# Patient Record
Sex: Female | Born: 1935 | Race: White | Hispanic: No | Marital: Married | Smoking: Never smoker
Health system: Southern US, Community
[De-identification: ages and names within clinical notes are randomized; demographics above are authoritative.]

## PROBLEM LIST (undated history)

## (undated) DIAGNOSIS — F0280 Dementia in other diseases classified elsewhere without behavioral disturbance: Secondary | ICD-10-CM

## (undated) DIAGNOSIS — G309 Alzheimer's disease, unspecified: Secondary | ICD-10-CM

---

## 1998-04-20 ENCOUNTER — Other Ambulatory Visit: Admission: RE | Admit: 1998-04-20 | Discharge: 1998-04-20 | Payer: Self-pay | Admitting: Obstetrics and Gynecology

## 2002-01-10 ENCOUNTER — Emergency Department (HOSPITAL_COMMUNITY): Admission: EM | Admit: 2002-01-10 | Discharge: 2002-01-10 | Payer: Self-pay | Admitting: Emergency Medicine

## 2005-02-24 ENCOUNTER — Emergency Department (HOSPITAL_COMMUNITY): Admission: EM | Admit: 2005-02-24 | Discharge: 2005-02-24 | Payer: Self-pay | Admitting: Family Medicine

## 2006-12-18 ENCOUNTER — Ambulatory Visit: Payer: Self-pay | Admitting: Internal Medicine

## 2006-12-25 ENCOUNTER — Other Ambulatory Visit: Admission: RE | Admit: 2006-12-25 | Discharge: 2006-12-25 | Payer: Self-pay | Admitting: Internal Medicine

## 2006-12-25 ENCOUNTER — Encounter: Payer: Self-pay | Admitting: Internal Medicine

## 2006-12-25 ENCOUNTER — Ambulatory Visit: Payer: Self-pay | Admitting: Internal Medicine

## 2006-12-25 DIAGNOSIS — M79609 Pain in unspecified limb: Secondary | ICD-10-CM

## 2006-12-25 LAB — CONVERTED CEMR LAB
Basophils Absolute: 0 10*3/uL (ref 0.0–0.1)
Basophils Relative: 0.6 % (ref 0.0–1.0)
Cholesterol: 256 mg/dL (ref 0–200)
Direct LDL: 169.9 mg/dL
Eosinophils Relative: 1.3 % (ref 0.0–5.0)
GFR calc non Af Amer: 74 mL/min
Glucose, Bld: 90 mg/dL (ref 70–99)
HCT: 37.2 % (ref 36.0–46.0)
Hemoglobin: 13 g/dL (ref 12.0–15.0)
Lymphocytes Relative: 27.7 % (ref 12.0–46.0)
MCHC: 35 g/dL (ref 30.0–36.0)
RBC: 4 M/uL (ref 3.87–5.11)
RDW: 12 % (ref 11.5–14.6)
Sodium: 142 meq/L (ref 135–145)
Total CHOL/HDL Ratio: 3.6
Triglycerides: 78 mg/dL (ref 0–149)
VLDL: 16 mg/dL (ref 0–40)

## 2007-01-03 ENCOUNTER — Encounter: Admission: RE | Admit: 2007-01-03 | Discharge: 2007-01-03 | Payer: Self-pay | Admitting: Internal Medicine

## 2007-01-11 ENCOUNTER — Encounter: Admission: RE | Admit: 2007-01-11 | Discharge: 2007-01-11 | Payer: Self-pay | Admitting: Internal Medicine

## 2010-10-16 ENCOUNTER — Encounter: Payer: Self-pay | Admitting: Internal Medicine

## 2011-02-27 ENCOUNTER — Inpatient Hospital Stay (INDEPENDENT_AMBULATORY_CARE_PROVIDER_SITE_OTHER)
Admission: RE | Admit: 2011-02-27 | Discharge: 2011-02-27 | Disposition: A | Discharge status: Home / Self Care | Payer: Medicare HMO | Source: Ambulatory Visit | Attending: Emergency Medicine | Admitting: Emergency Medicine

## 2011-02-27 DIAGNOSIS — S81809A Unspecified open wound, unspecified lower leg, initial encounter: Secondary | ICD-10-CM

## 2011-02-27 DIAGNOSIS — S8010XA Contusion of unspecified lower leg, initial encounter: Secondary | ICD-10-CM

## 2011-06-05 ENCOUNTER — Other Ambulatory Visit: Payer: Self-pay | Admitting: Dermatology

## 2011-10-09 ENCOUNTER — Other Ambulatory Visit: Payer: Self-pay | Admitting: Dermatology

## 2012-10-15 ENCOUNTER — Ambulatory Visit
Admission: RE | Admit: 2012-10-15 | Discharge: 2012-10-15 | Disposition: A | Discharge status: Home / Self Care | Payer: Medicare HMO | Source: Ambulatory Visit | Attending: Geriatric Medicine | Admitting: Geriatric Medicine

## 2012-10-15 ENCOUNTER — Other Ambulatory Visit: Payer: Self-pay | Admitting: Geriatric Medicine

## 2012-10-15 DIAGNOSIS — R269 Unspecified abnormalities of gait and mobility: Secondary | ICD-10-CM

## 2013-11-20 ENCOUNTER — Ambulatory Visit (HOSPITAL_COMMUNITY)
Admission: RE | Admit: 2013-11-20 | Discharge: 2013-11-20 | Disposition: A | Discharge status: Home / Self Care | Payer: Medicare HMO | Source: Ambulatory Visit | Attending: Geriatric Medicine | Admitting: Geriatric Medicine

## 2013-11-20 ENCOUNTER — Other Ambulatory Visit (HOSPITAL_COMMUNITY): Payer: Self-pay | Admitting: Unknown Physician Specialty

## 2013-11-20 ENCOUNTER — Other Ambulatory Visit: Payer: Self-pay | Admitting: Geriatric Medicine

## 2013-11-20 ENCOUNTER — Other Ambulatory Visit (HOSPITAL_COMMUNITY): Payer: Self-pay | Admitting: Geriatric Medicine

## 2013-11-20 DIAGNOSIS — I82403 Acute embolism and thrombosis of unspecified deep veins of lower extremity, bilateral: Secondary | ICD-10-CM

## 2013-11-20 DIAGNOSIS — R609 Edema, unspecified: Secondary | ICD-10-CM | POA: Insufficient documentation

## 2013-11-20 DIAGNOSIS — M79606 Pain in leg, unspecified: Secondary | ICD-10-CM

## 2013-11-20 DIAGNOSIS — R7989 Other specified abnormal findings of blood chemistry: Secondary | ICD-10-CM

## 2013-11-20 DIAGNOSIS — M79609 Pain in unspecified limb: Secondary | ICD-10-CM | POA: Insufficient documentation

## 2013-11-20 DIAGNOSIS — M7989 Other specified soft tissue disorders: Secondary | ICD-10-CM

## 2013-11-20 DIAGNOSIS — I82409 Acute embolism and thrombosis of unspecified deep veins of unspecified lower extremity: Secondary | ICD-10-CM

## 2013-11-20 NOTE — Progress Notes (Signed)
VASCULAR LAB PRELIMINARY  PRELIMINARY  PRELIMINARY  PRELIMINARY  Bilateral lower extremity venous duplex completed.    Preliminary report:  Bilateral:  No evidence of DVT, superficial thrombosis, or Baker's Cyst.   Christa Fasig, RVS 11/20/2013, 12:47 PM

## 2014-02-21 ENCOUNTER — Encounter (HOSPITAL_COMMUNITY): Payer: Self-pay | Admitting: Emergency Medicine

## 2014-02-21 ENCOUNTER — Emergency Department (HOSPITAL_COMMUNITY)
Admission: EM | Admit: 2014-02-21 | Discharge: 2014-02-22 | Disposition: A | Discharge status: Home / Self Care | Payer: Medicare HMO | Attending: Emergency Medicine | Admitting: Emergency Medicine

## 2014-02-21 ENCOUNTER — Emergency Department (HOSPITAL_COMMUNITY): Payer: Medicare HMO

## 2014-02-21 DIAGNOSIS — F0280 Dementia in other diseases classified elsewhere without behavioral disturbance: Secondary | ICD-10-CM | POA: Insufficient documentation

## 2014-02-21 DIAGNOSIS — R109 Unspecified abdominal pain: Secondary | ICD-10-CM

## 2014-02-21 DIAGNOSIS — R1013 Epigastric pain: Secondary | ICD-10-CM | POA: Insufficient documentation

## 2014-02-21 DIAGNOSIS — K5900 Constipation, unspecified: Secondary | ICD-10-CM | POA: Insufficient documentation

## 2014-02-21 DIAGNOSIS — R1012 Left upper quadrant pain: Secondary | ICD-10-CM | POA: Insufficient documentation

## 2014-02-21 DIAGNOSIS — G309 Alzheimer's disease, unspecified: Secondary | ICD-10-CM | POA: Insufficient documentation

## 2014-02-21 HISTORY — DX: Dementia in other diseases classified elsewhere, unspecified severity, without behavioral disturbance, psychotic disturbance, mood disturbance, and anxiety: F02.80

## 2014-02-21 HISTORY — DX: Alzheimer's disease, unspecified: G30.9

## 2014-02-21 LAB — COMPREHENSIVE METABOLIC PANEL
ALBUMIN: 3.5 g/dL (ref 3.5–5.2)
ALK PHOS: 56 U/L (ref 39–117)
ALT: 29 U/L (ref 0–35)
AST: 31 U/L (ref 0–37)
BUN: 24 mg/dL — AB (ref 6–23)
CHLORIDE: 102 meq/L (ref 96–112)
CO2: 25 mEq/L (ref 19–32)
Calcium: 9.2 mg/dL (ref 8.4–10.5)
Creatinine, Ser: 1.01 mg/dL (ref 0.50–1.10)
GFR calc Af Amer: 58 mL/min — ABNORMAL LOW (ref >90)
GFR, EST NON AFRICAN AMERICAN: 50 mL/min — AB (ref >90)
Glucose, Bld: 101 mg/dL — ABNORMAL HIGH (ref 70–99)
POTASSIUM: 4.2 meq/L (ref 3.7–5.3)
Sodium: 138 mEq/L (ref 137–147)
Total Protein: 6.9 g/dL (ref 6.0–8.3)

## 2014-02-21 LAB — URINALYSIS, ROUTINE W REFLEX MICROSCOPIC
BILIRUBIN URINE: NEGATIVE
GLUCOSE, UA: NEGATIVE mg/dL
HGB URINE DIPSTICK: NEGATIVE
Ketones, ur: NEGATIVE mg/dL
LEUKOCYTES UA: NEGATIVE
Nitrite: NEGATIVE
PH: 5.5 (ref 5.0–8.0)
Protein, ur: NEGATIVE mg/dL
Specific Gravity, Urine: 1.015 (ref 1.005–1.030)
Urobilinogen, UA: 0.2 mg/dL (ref 0.0–1.0)

## 2014-02-21 LAB — CBC WITH DIFFERENTIAL/PLATELET
BASOS ABS: 0 10*3/uL (ref 0.0–0.1)
Basophils Relative: 0 % (ref 0–1)
Eosinophils Absolute: 0.2 10*3/uL (ref 0.0–0.7)
Eosinophils Relative: 2 % (ref 0–5)
HEMATOCRIT: 34.4 % — AB (ref 36.0–46.0)
HEMOGLOBIN: 11.6 g/dL — AB (ref 12.0–15.0)
Lymphocytes Relative: 19 % (ref 12–46)
Lymphs Abs: 1.7 10*3/uL (ref 0.7–4.0)
MCH: 31.8 pg (ref 26.0–34.0)
MCHC: 33.7 g/dL (ref 30.0–36.0)
MCV: 94.2 fL (ref 78.0–100.0)
MONOS PCT: 12 % (ref 3–12)
Monocytes Absolute: 1.1 10*3/uL — ABNORMAL HIGH (ref 0.1–1.0)
NEUTROS ABS: 6.2 10*3/uL (ref 1.7–7.7)
NEUTROS PCT: 67 % (ref 43–77)
Platelets: 291 10*3/uL (ref 150–400)
RBC: 3.65 MIL/uL — ABNORMAL LOW (ref 3.87–5.11)
RDW: 12.8 % (ref 11.5–15.5)
WBC: 9.3 10*3/uL (ref 4.0–10.5)

## 2014-02-21 LAB — LIPASE, BLOOD: Lipase: 53 U/L (ref 11–59)

## 2014-02-21 MED ORDER — SODIUM CHLORIDE 0.9 % IV BOLUS (SEPSIS)
500.0000 mL | Freq: Once | INTRAVENOUS | Status: AC
  Administered 2014-02-22: 500 mL via INTRAVENOUS

## 2014-02-21 MED ORDER — ONDANSETRON HCL 4 MG/2ML IJ SOLN
4.0000 mg | Freq: Once | INTRAMUSCULAR | Status: AC
  Administered 2014-02-22: 4 mg via INTRAVENOUS
  Filled 2014-02-21: qty 2

## 2014-02-21 MED ORDER — FENTANYL CITRATE 0.05 MG/ML IJ SOLN
12.5000 ug | Freq: Once | INTRAMUSCULAR | Status: AC
  Administered 2014-02-22: 12.5 ug via INTRAVENOUS
  Filled 2014-02-21: qty 2

## 2014-02-21 NOTE — ED Notes (Signed)
Pt presents with Left side abd pain and bloating starting today after eating dinner. Pt reports increase pain to LUQ region. Pt denies nausea, vomiting, or diarrhea. Pt reports a normal BM yesterday. Pt took 2 Senakots with no relief. Pt denies passing gas or belching since dinner. Pt has a hx of Alzheimer.

## 2014-02-21 NOTE — ED Provider Notes (Signed)
CSN: [id number]     Arrival date & time 02/21/14  2053 History   First MD Initiated Contact with Patient 02/21/14 2238     Chief Complaint  Patient presents with  . Abdominal Pain     (Consider location/radiation/quality/duration/timing/severity/associated sxs/prior Treatment) HPI  Patient with hx advanced alzheimer disease p/w LUQ abdominal pain and bloating that began after dinner tonight.  Family had bbq chicken, red beans and rice, fresh greens.  Pt complained of pain and grabbed her LUQ and daughter felt patient's abdomen and thought it felt bloated.  Daughter gave patient two senna tabs.  Pt reports abdominal pain.  Denies CP, SOB.  Daughter reports patient has not been sick recently.  Level V caveat for dementia.    Past Medical History  Diagnosis Date  . Alzheimer disease    History reviewed. No pertinent past surgical history. History reviewed. No pertinent family history. History  Substance Use Topics  . Smoking status: Never Smoker   . Smokeless tobacco: Not on file  . Alcohol Use: No   OB History   Grav Para Term Preterm Abortions TAB SAB Ect Mult Living                 Review of Systems  Unable to perform ROS: Dementia      Allergies  Review of patient's allergies indicates no known allergies.  Home Medications   Prior to Admission medications   Medication Sig Start Date End Date Taking? Authorizing Provider  senna (SENOKOT) 8.6 MG tablet Take 2 tablets by mouth daily as needed for constipation.   Yes Historical Provider, MD   BP 120/49  Pulse 78  Temp(Src) 98 F (36.7 C) (Oral)  Resp 16  SpO2 98% Physical Exam  Nursing note and vitals reviewed. Constitutional: She appears well-developed and well-nourished. No distress.  HENT:  Head: Normocephalic and atraumatic.  Neck: Neck supple.  Cardiovascular: Normal rate and regular rhythm.   Pulmonary/Chest: Effort normal and breath sounds normal. No respiratory distress. She has no wheezes. She has  no rales.  Abdominal: Soft. She exhibits no distension. There is tenderness in the epigastric area and left upper quadrant. There is no rebound and no guarding.  Neurological: She is alert.  Skin: She is not diaphoretic.    ED Course  Procedures (including critical care time) Labs Review Labs Reviewed  CBC WITH DIFFERENTIAL - Abnormal; Notable for the following:    RBC 3.65 (*)    Hemoglobin 11.6 (*)    HCT 34.4 (*)    Monocytes Absolute 1.1 (*)    All other components within normal limits  COMPREHENSIVE METABOLIC PANEL - Abnormal; Notable for the following:    Glucose, Bld 101 (*)    BUN 24 (*)    Total Bilirubin <0.2 (*)    GFR calc non Af Amer 50 (*)    GFR calc Af Amer 58 (*)    All other components within normal limits  LIPASE, BLOOD  URINALYSIS, ROUTINE W REFLEX MICROSCOPIC    Imaging Review Dg Abd Acute W/chest  02/22/2014   CLINICAL DATA:  80 year old female with abdominal pain, left upper quadrant pain. Initial encounter.  EXAM: ACUTE ABDOMEN SERIES (ABDOMEN 2 VIEW & CHEST 1 VIEW)  COMPARISON:  None.  FINDINGS: Portable AP semi upright view of the chest at 0015 hrs. Lung volumes within normal limits. Normal cardiac size and mediastinal contours. Visualized tracheal air column is within normal limits. No pneumothorax or pneumoperitoneum. No confluent pulmonary opacity.  Supine and  left-side-down lateral decubitus views of the abdomen. No pneumoperitoneum. Non obstructed bowel gas pattern. Retained stool in the colon. Osteopenia. Scoliosis and degenerative changes in the lumbar spine. Advanced joint space loss and subchondral sclerosis at the right hip. No acute osseous abnormality identified.  IMPRESSION: 1.  Non obstructed bowel gas pattern, no free air. 2.  No acute cardiopulmonary abnormality. 3. Degenerative osseous changes including severe right hip osteoarthritis.   Electronically Signed   By: Augusto Gamble M.D.   On: 02/22/2014 00:20     EKG Interpretation None       12:48 AM Discussed pt with Dr Ranae Palms.  Will order CT abd/pelvis.  Dr Ranae Palms to assume care of patient at change of shift.   Reexamination of abdomen is unchanged.    MDM   Final diagnoses:  Abdominal pain    Pt with dementia p/w LUQ abdominal pain and bloating that began after dinner tonight.  Brought in by daughter.  Labs, UA unremarkable, xray negative.  CT abd/pelvis pending at change of shift.  Dr Ranae Palms to assume care.     [place], PA-C 02/22/14 [id number]

## 2014-02-22 ENCOUNTER — Emergency Department (HOSPITAL_COMMUNITY): Payer: Medicare HMO

## 2014-02-22 MED ORDER — IOHEXOL 300 MG/ML  SOLN
100.0000 mL | Freq: Once | INTRAMUSCULAR | Status: AC | PRN
  Administered 2014-02-22: 100 mL via INTRAVENOUS

## 2014-02-22 MED ORDER — POLYETHYLENE GLYCOL 3350 17 G PO PACK: 17.0000 g | PACK | Freq: Every day | ORAL | Status: AC

## 2014-02-22 NOTE — ED Provider Notes (Signed)
Medical screening examination/treatment/procedure(s) were performed by non-physician practitioner and as supervising physician I was immediately available for consultation/collaboration.   EKG Interpretation None        Malekai Markwood, MD 02/22/14 0744 

## 2014-02-22 NOTE — Discharge Instructions (Signed)
Abdominal Pain, Adult °Many things can cause abdominal pain. Usually, abdominal pain is not caused by a disease and will improve without treatment. It can often be observed and treated at home. Your health care provider will do a physical exam and possibly order blood tests and X-rays to help determine the seriousness of your pain. However, in many cases, more time must pass before a clear cause of the pain can be found. Before that point, your health care provider may not know if you need more testing or further treatment. °HOME CARE INSTRUCTIONS  °Monitor your abdominal pain for any changes. The following actions may help to alleviate any discomfort you are experiencing: °· Only take over-the-counter or prescription medicines as directed by your health care provider. °· Do not take laxatives unless directed to do so by your health care provider. °· Try a clear liquid diet (broth, tea, or water) as directed by your health care provider. Slowly move to a bland diet as tolerated. °SEEK MEDICAL CARE IF: °· You have unexplained abdominal pain. °· You have abdominal pain associated with nausea or diarrhea. °· You have pain when you urinate or have a bowel movement. °· You experience abdominal pain that wakes you in the night. °· You have abdominal pain that is worsened or improved by eating food. °· You have abdominal pain that is worsened with eating fatty foods. °SEEK IMMEDIATE MEDICAL CARE IF:  °· Your pain does not go away within 2 hours. °· You have a fever. °· You keep throwing up (vomiting). °· Your pain is felt only in portions of the abdomen, such as the right side or the left lower portion of the abdomen. °· You pass bloody or black tarry stools. °MAKE SURE YOU: °· Understand these instructions.   °· Will watch your condition.   °· Will get help right away if you are not doing well or get worse.   °Document Released: 06/21/2005 Document Revised: 07/02/2013 Document Reviewed: 05/21/2013 °ExitCare® Patient  Information ©2014 [employer]. °Constipation, Adult °Constipation is when a person has fewer than 3 bowel movements a week; has difficulty having a bowel movement; or has stools that are dry, hard, or larger than normal. As people grow older, constipation is more common. If you try to fix constipation with medicines that make you have a bowel movement (laxatives), the problem may get worse. Long-term laxative use may cause the muscles of the colon to become weak. A low-fiber diet, not taking in enough fluids, and taking certain medicines may make constipation worse. °CAUSES  °· Certain medicines, such as antidepressants, pain medicine, iron supplements, antacids, and water pills.   °· Certain diseases, such as diabetes, irritable bowel syndrome (IBS), thyroid disease, or depression.   °· Not drinking enough water.   °· Not eating enough fiber-rich foods.   °· Stress or travel. °· Lack of physical activity or exercise. °· Not going to the restroom when there is the urge to have a bowel movement. °· Ignoring the urge to have a bowel movement. °· Using laxatives too much. °SYMPTOMS  °· Having fewer than 3 bowel movements a week.   °· Straining to have a bowel movement.   °· Having hard, dry, or larger than normal stools.   °· Feeling full or bloated.   °· Pain in the lower abdomen. °· Not feeling relief after having a bowel movement. °DIAGNOSIS  °Your caregiver will take a medical history and perform a physical exam. Further testing may be done for severe constipation. Some tests may include:  °· A barium enema   X-ray to examine your rectum, colon, and sometimes, your small intestine. °· A sigmoidoscopy to examine your lower colon. °· A colonoscopy to examine your entire colon. °TREATMENT  °Treatment will depend on the severity of your constipation and what is causing it. Some dietary treatments include drinking more fluids and eating more fiber-rich foods. Lifestyle treatments may include regular exercise. If these  diet and lifestyle recommendations do not help, your caregiver may recommend taking over-the-counter laxative medicines to help you have bowel movements. Prescription medicines may be prescribed if over-the-counter medicines do not work.  °HOME CARE INSTRUCTIONS  °· Increase dietary fiber in your diet, such as fruits, vegetables, whole grains, and beans. Limit high-fat and processed sugars in your diet, such as French fries, hamburgers, cookies, candies, and soda.   °· A fiber supplement may be added to your diet if you cannot get enough fiber from foods.   °· Drink enough fluids to keep your urine clear or pale yellow.   °· Exercise regularly or as directed by your caregiver.   °· Go to the restroom when you have the urge to go. Do not hold it. °· Only take medicines as directed by your caregiver. Do not take other medicines for constipation without talking to your caregiver first. °SEEK IMMEDIATE MEDICAL CARE IF:  °· You have bright red blood in your stool.   °· Your constipation lasts for more than 4 days or gets worse.   °· You have abdominal or rectal pain.   °· You have thin, pencil-like stools. °· You have unexplained weight loss. °MAKE SURE YOU:  °· Understand these instructions. °· Will watch your condition. °· Will get help right away if you are not doing well or get worse. °Document Released: 06/09/2004 Document Revised: 12/04/2011 Document Reviewed: 06/23/2013 °ExitCare® Patient Information ©2014 [employer]. ° °

## 2014-03-18 ENCOUNTER — Ambulatory Visit: Payer: Self-pay | Admitting: Podiatry

## 2014-03-30 ENCOUNTER — Ambulatory Visit: Payer: Self-pay | Admitting: Podiatry

## 2014-04-20 ENCOUNTER — Encounter: Payer: Self-pay | Admitting: Podiatry

## 2014-04-20 ENCOUNTER — Ambulatory Visit (INDEPENDENT_AMBULATORY_CARE_PROVIDER_SITE_OTHER): Payer: Commercial Managed Care - HMO | Admitting: Podiatry

## 2014-04-20 VITALS — BP 128/66 | HR 68 | Resp 16

## 2014-04-20 DIAGNOSIS — M79609 Pain in unspecified limb: Secondary | ICD-10-CM

## 2014-04-20 DIAGNOSIS — M79673 Pain in unspecified foot: Secondary | ICD-10-CM

## 2014-04-20 DIAGNOSIS — M2010 Hallux valgus (acquired), unspecified foot: Secondary | ICD-10-CM

## 2014-04-20 DIAGNOSIS — M2040 Other hammer toe(s) (acquired), unspecified foot: Secondary | ICD-10-CM

## 2014-04-20 DIAGNOSIS — B351 Tinea unguium: Secondary | ICD-10-CM

## 2014-04-20 NOTE — Progress Notes (Signed)
Subjective:     Patient ID: Yolanda Ray, female   DOB: 1927/12/13, 80 y.o.   MRN: [id number]  Toe Pain    patient presents with forefoot instability of both feet with thick nail disease that are painful for her to cut and impossible for her caregiver to cut. She tries to wear wider shoes but her feet do hurt   Review of Systems  All other systems reviewed and are negative.      Objective:   Physical Exam  Nursing note and vitals reviewed. Cardiovascular: Intact distal pulses.   Musculoskeletal: Normal range of motion.  Skin: Skin is warm and dry.   neurovascular status intact with muscle strength adequate and range of motion of the subtalar and midtarsal joint moderately diminished. Patient is found to have significant for foot structural malalignment of the feet was structural bunion deformity hammertoe deformity and severe nail disease with brittle yellow painful nailbeds 1-5 both feet     Assessment:     Structural abnormality the feet was structural nail disease and pain 1-5 of both feet    Plan:     H&P reviewed and debrided nailbeds 1-5 both feet and instructed on wider-type shoes soaks and regular nail care. Will be seen back for is to recheck 3 months earlier if any issues should occur

## 2014-04-20 NOTE — Progress Notes (Signed)
   Subjective:    Patient ID: Yolanda MandesHarriette A Winther, female    DOB: 04-21-1928, [age] y.o.   MRN: [id number]  HPI Comments: "The 4th toe hurts"  Patient c/o tenderness 4th toe left for few months. The toe is most sensitive lateral border of nail. The toe is turned and she is walking on it. The other nails are long and thick and she would like those trimmed as well.   Toe Pain       Review of Systems  All other systems reviewed and are negative.      Objective:   Physical Exam        Assessment & Plan:

## 2014-07-27 ENCOUNTER — Ambulatory Visit (INDEPENDENT_AMBULATORY_CARE_PROVIDER_SITE_OTHER): Payer: Commercial Managed Care - HMO | Admitting: Podiatry

## 2014-07-27 DIAGNOSIS — B351 Tinea unguium: Secondary | ICD-10-CM

## 2014-07-27 DIAGNOSIS — M79673 Pain in unspecified foot: Secondary | ICD-10-CM

## 2014-07-28 NOTE — Progress Notes (Signed)
Subjective:     Patient ID: Yolanda Ray, female   DOB: May 09, 1928, 80 y.o.   MRN: [id number]  HPIpatient presents with thick yellow nailbeds 1-5 both feet that are painful and she cannot cut   Review of Systems     Objective:   Physical Exam Neurovascular status intact with thick yellow brittle nailbeds 1-5 both feet that are painful    Assessment:     Mycotic nail infection with pain 1-5 both feet    Plan:     Debris painful nailbeds 1-5 both feet with no iatrogenic bleeding noted

## 2014-08-18 ENCOUNTER — Encounter (HOSPITAL_COMMUNITY): Payer: Self-pay | Admitting: Emergency Medicine

## 2014-08-18 ENCOUNTER — Emergency Department (HOSPITAL_COMMUNITY)
Admission: EM | Admit: 2014-08-18 | Discharge: 2014-08-18 | Disposition: A | Discharge status: Home / Self Care | Payer: Medicare HMO | Attending: Emergency Medicine | Admitting: Emergency Medicine

## 2014-08-18 DIAGNOSIS — S63613A Unspecified sprain of left middle finger, initial encounter: Secondary | ICD-10-CM | POA: Diagnosis not present

## 2014-08-18 DIAGNOSIS — G309 Alzheimer's disease, unspecified: Secondary | ICD-10-CM | POA: Insufficient documentation

## 2014-08-18 DIAGNOSIS — S60413A Abrasion of left middle finger, initial encounter: Secondary | ICD-10-CM | POA: Insufficient documentation

## 2014-08-18 DIAGNOSIS — Y9301 Activity, walking, marching and hiking: Secondary | ICD-10-CM | POA: Diagnosis not present

## 2014-08-18 DIAGNOSIS — Y9289 Other specified places as the place of occurrence of the external cause: Secondary | ICD-10-CM | POA: Insufficient documentation

## 2014-08-18 DIAGNOSIS — Z79899 Other long term (current) drug therapy: Secondary | ICD-10-CM | POA: Insufficient documentation

## 2014-08-18 DIAGNOSIS — W19XXXA Unspecified fall, initial encounter: Secondary | ICD-10-CM

## 2014-08-18 DIAGNOSIS — W1839XA Other fall on same level, initial encounter: Secondary | ICD-10-CM | POA: Insufficient documentation

## 2014-08-18 DIAGNOSIS — S6992XA Unspecified injury of left wrist, hand and finger(s), initial encounter: Secondary | ICD-10-CM | POA: Diagnosis present

## 2014-08-18 DIAGNOSIS — Y998 Other external cause status: Secondary | ICD-10-CM | POA: Insufficient documentation

## 2014-08-18 DIAGNOSIS — R4182 Altered mental status, unspecified: Secondary | ICD-10-CM | POA: Insufficient documentation

## 2014-08-18 DIAGNOSIS — S63619A Unspecified sprain of unspecified finger, initial encounter: Secondary | ICD-10-CM

## 2014-08-18 DIAGNOSIS — S60419A Abrasion of unspecified finger, initial encounter: Secondary | ICD-10-CM

## 2014-08-18 NOTE — Discharge Instructions (Signed)
°  Jammed Finger A jammed finger is a term used to describe a variety of injuries. The injuries usually involve the joint in the middle of the finger (not the joint near the tip of the finger, and not the joint close to the hand). Usually, a jammed finger involves injured tendons or ligaments (sprain). CAUSES  "Jamming" a finger usually refers to "stubbing" the finger on an object, such as a ball during an athletic activity. Usually, the joint is extended at the time of injury, and the blow forces the joint further into extension than it normally goes. SYMPTOMS   Pain.  Swelling.  Discoloration and bruising around the joint.  Difficulty bending, straightening, and using the finger normally. DIAGNOSIS  An X-ray may be done to make sure there is no broken bone (fracture). TREATMENT   Put ice on the injured area.  Put ice in a plastic bag.  Place a towel between your skin and the bag.  Leave the ice on for 15-20 minutes at a time, 03-04 times a day.  Raise (elevate) the affected finger above the level of your heart to decrease swelling.  Take medicine as directed by your caregiver. Depending on the type of injury, your caregiver may also recommend that you:  "Buddy tape" the injured finger to the finger or fingers beside it.  Wear a protective splint.  Do strengthening exercises after the finger has begun to heal.  Do physical therapy to regain strength and mobility in the finger.  Follow up with a hand specialist. HOME CARE INSTRUCTIONS  Avoid activities that may injure the finger again until it is totally healed. SEEK IMMEDIATE MEDICAL CARE IF:   You develop pain that is more severe.  You develop increased swelling.  There is an obvious deformity in the joint.  You have severe bruising.  You have red or blue discoloration.  You or your child has an oral temperature above 102 F (38.9 C), not controlled by medicine.  You have an abnormally cold finger.  Feeling  in your finger is absent or decreasing. MAKE SURE YOU:   Understand these instructions.  Will watch your condition.  Will get help right away if you are not doing well or get worse. Document Released: 03/01/2010 Document Revised: 12/04/2011 Document Reviewed: 03/01/2010 [facility] Patient Information 2015 [place], [place]. This information is not intended to replace advice given to you by your health care provider. Make sure you discuss any questions you have with your health care provider.

## 2014-08-18 NOTE — ED Provider Notes (Signed)
CSN: [id number]     Arrival date & time 08/18/14  1128 History   First MD Initiated Contact with Patient 08/18/14 1137     Chief Complaint  Patient presents with  . Fall  . Altered Mental Status     (Consider location/radiation/quality/duration/timing/severity/associated sxs/prior Treatment) Patient is a 80 y.o. female presenting with fall and altered mental status. The history is provided by the patient and the EMS personnel.  Fall  Altered Mental Status  The patient was seen, by a bystander, to fall, while walking on [address]  Decreased responsiveness, so an ambulance was summoned.  They checked her CBG, and it was normal.  They transferred her here for evaluation.  Level V caveat- dementia  Past Medical History  Diagnosis Date  . Alzheimer disease    History reviewed. No pertinent past surgical history. History reviewed. No pertinent family history. History  Substance Use Topics  . Smoking status: Never Smoker   . Smokeless tobacco: Not on file  . Alcohol Use: No   OB History    No data available     Review of Systems  All other systems reviewed and are negative.     Allergies  Review of patient's allergies indicates no known allergies.  Home Medications   Prior to Admission medications   Medication Sig Start Date End Date Taking? Authorizing Provider  polyethylene glycol (MIRALAX / GLYCOLAX) packet Take 17 g by mouth daily. Patient taking differently: Take 17 g by mouth daily as needed (constipation).  02/22/14   Loren Raceravid Yelverton, MD  senna (SENOKOT) 8.6 MG tablet Take 2 tablets by mouth daily as needed for constipation.    Historical Provider, MD   BP 124/53 mmHg  Pulse 75  Resp 16  SpO2 98% Physical Exam  Constitutional: She appears well-developed.  Elderly, frail  HENT:  Head: Normocephalic and atraumatic.  Right Ear: External ear normal.  Left Ear: External ear normal.  Eyes: Conjunctivae and EOM are normal. Pupils are equal, round, and  reactive to light.  Neck: Normal range of motion and phonation normal. Neck supple.  Cardiovascular: Normal rate, regular rhythm and normal heart sounds.   Pulmonary/Chest: Effort normal and breath sounds normal. She exhibits no bony tenderness.  Abdominal: Soft. There is no tenderness.  Musculoskeletal: Normal range of motion.  Small abrasion, and swelling of the DIP of the left third finger.  No deformity.  Neurological: She is alert. No cranial nerve deficit or sensory deficit. She exhibits normal muscle tone. Coordination normal.  Skin: Skin is warm, dry and intact.  Psychiatric: She has a normal mood and affect. Her behavior is normal.  Nursing note and vitals reviewed.   ED Course  Procedures (including critical care time) Labs Review Labs Reviewed - No data to display  Imaging Review No results found.   EKG Interpretation None      MDM   Final diagnoses:  Fall, initial encounter  Sprain, finger, initial encounter  Abrasion of finger, initial encounter    Fall without serious injury. Doubt serious injury, SBI, metabolic instability or impending vascular collapse.  Nursing Notes Reviewed/ Care Coordinated Applicable Imaging Reviewed Interpretation of Laboratory Data incorporated into ED treatment  The patient appears reasonably screened and/or stabilized for discharge and I doubt any other medical condition or other [facility] requiring further screening, evaluation, or treatment in the ED at this time prior to discharge.  Plan: Home Medications- usual; Home Treatments- rest; return here if the recommended treatment, does not improve the  symptoms; Recommended follow up- PCP prn     Flint MelterElliott L Mariam Helbert, MD 08/19/14 1047

## 2014-08-18 NOTE — ED Notes (Addendum)
Per ems, pt found after fall while walking [address]. Per ems, pt confused, disoriented to place, time, and event.pt arrives awake and alert

## 2014-08-18 NOTE — ED Notes (Signed)
Son Merlyn AlbertFred at bedside

## 2014-08-18 NOTE — ED Notes (Signed)
Spoke with patient son Yolanda Ray about presence of mother in ED.  Fred states advanced alzheimers with dementia is baseline for mother, similar incidences have occurred in the past

## 2014-09-03 ENCOUNTER — Other Ambulatory Visit: Payer: Self-pay | Admitting: Geriatric Medicine

## 2014-09-03 DIAGNOSIS — M545 Low back pain: Secondary | ICD-10-CM

## 2014-09-03 DIAGNOSIS — M79604 Pain in right leg: Secondary | ICD-10-CM

## 2014-09-08 ENCOUNTER — Ambulatory Visit
Admission: RE | Admit: 2014-09-08 | Discharge: 2014-09-08 | Disposition: A | Discharge status: Home / Self Care | Payer: Medicare HMO | Source: Ambulatory Visit | Attending: Geriatric Medicine | Admitting: Geriatric Medicine

## 2014-09-08 DIAGNOSIS — M545 Low back pain: Secondary | ICD-10-CM

## 2014-09-08 DIAGNOSIS — M79604 Pain in right leg: Secondary | ICD-10-CM

## 2014-10-31 ENCOUNTER — Emergency Department (HOSPITAL_COMMUNITY)
Admission: EM | Admit: 2014-10-31 | Discharge: 2014-10-31 | Disposition: A | Discharge status: Home / Self Care | Payer: Commercial Managed Care - HMO | Attending: Emergency Medicine | Admitting: Emergency Medicine

## 2014-10-31 ENCOUNTER — Encounter (HOSPITAL_COMMUNITY): Payer: Self-pay | Admitting: Emergency Medicine

## 2014-10-31 DIAGNOSIS — F0280 Dementia in other diseases classified elsewhere without behavioral disturbance: Secondary | ICD-10-CM | POA: Diagnosis not present

## 2014-10-31 DIAGNOSIS — W1839XA Other fall on same level, initial encounter: Secondary | ICD-10-CM | POA: Insufficient documentation

## 2014-10-31 DIAGNOSIS — L03113 Cellulitis of right upper limb: Secondary | ICD-10-CM | POA: Diagnosis not present

## 2014-10-31 DIAGNOSIS — G309 Alzheimer's disease, unspecified: Secondary | ICD-10-CM | POA: Diagnosis not present

## 2014-10-31 DIAGNOSIS — Y9289 Other specified places as the place of occurrence of the external cause: Secondary | ICD-10-CM | POA: Insufficient documentation

## 2014-10-31 DIAGNOSIS — Y9389 Activity, other specified: Secondary | ICD-10-CM | POA: Insufficient documentation

## 2014-10-31 DIAGNOSIS — Y998 Other external cause status: Secondary | ICD-10-CM | POA: Diagnosis not present

## 2014-10-31 DIAGNOSIS — H02109 Unspecified ectropion of unspecified eye, unspecified eyelid: Secondary | ICD-10-CM | POA: Insufficient documentation

## 2014-10-31 DIAGNOSIS — S4991XA Unspecified injury of right shoulder and upper arm, initial encounter: Secondary | ICD-10-CM | POA: Diagnosis present

## 2014-10-31 MED ORDER — AMOXICILLIN-POT CLAVULANATE 400-57 MG/5ML PO SUSR: 400.0000 mg | Freq: Three times a day (TID) | ORAL | Status: AC

## 2014-10-31 NOTE — ED Notes (Signed)
Pt and pt's daughter comfortable with discharge and follow up instructions. Prescriptions x1. 

## 2014-10-31 NOTE — ED Notes (Signed)
Visitor reports that pt fell off of mattress that's low to the ground this morning onto pillows on ground. Pt c/o pain to right shoulder. Pt has been falling out of the bed for the past week.

## 2014-10-31 NOTE — ED Provider Notes (Signed)
CSN: [id number]     Arrival date & time 10/31/14  1415 History   First MD Initiated Contact with Patient 10/31/14 1526     Chief Complaint  Patient presents with  . Fall  . Shoulder Pain     (Consider location/radiation/quality/duration/timing/severity/associated sxs/prior Treatment) HPI The patient has rapidly advancing Alzheimer's disease. Her daughter reports that she has frequent falls. She reports that this morning she was wedged between some pillows beside her bed lying on her right shoulder. She reports she has her sleeping on a mattress with pillows around the bed due to the fact that she used to frequent fall out of the bed. There was no associated fall from any height. She reports after she got her up however she noticed a reddened area on the top of her shoulder. She reports with pressure on the area the patient expresses pain. She has not had any other acute problems. She has not been resistant to using the arm or moving about. Past Medical History  Diagnosis Date  . Alzheimer disease    History reviewed. No pertinent past surgical history. No family history on file. History  Substance Use Topics  . Smoking status: Never Smoker   . Smokeless tobacco: Not on file  . Alcohol Use: No   OB History    No data available     Review of Systems 10 Systems reviewed and are negative for acute change except as noted in the HPI. The review of systems, and the patient's daughter.   Allergies  Review of patient's allergies indicates no known allergies.  Home Medications   Prior to Admission medications   Medication Sig Start Date End Date Taking? Authorizing Provider  amoxicillin-clavulanate (AUGMENTIN) 400-57 MG/5ML suspension Take 5 mLs (400 mg total) by mouth 3 (three) times daily. 10/31/14   Arby BarretteMarcy Torres Hardenbrook, MD  polyethylene glycol (MIRALAX / GLYCOLAX) packet Take 17 g by mouth daily. Patient taking differently: Take 17 g by mouth daily as needed (constipation).  02/22/14    Loren Raceravid Yelverton, MD   BP 146/75 mmHg  Pulse 107  Temp(Src) 97.5 F (36.4 C) (Oral)  Resp 18  SpO2 99% Physical Exam  Constitutional: She appears well-developed and well-nourished.  HENT:  Head: Normocephalic and atraumatic.  Eyes: EOM are normal. Pupils are equal, round, and reactive to light.  Mild bilateral ectropion with erythema around the margins.  Neck: Neck supple.  Cardiovascular: Normal rate, regular rhythm, normal heart sounds and intact distal pulses.   Pulmonary/Chest: Effort normal and breath sounds normal.  Abdominal: She exhibits no distension. There is no tenderness.  Musculoskeletal: Normal range of motion. She exhibits tenderness. She exhibits no edema.  The right shoulder has a round area of erythema that is approximately 8 cm. This is moderately tender to palpation. The patient has intact range of motion in the shoulder she does not express pain with flexion and extension shoulder. There is no edema of the arm and the distal neurovascular examination is normal. There is no erythema streaking up and about the neck and there is no cervical lymphadenopathy. See attached image  Neurological: She is alert. She has normal strength. Coordination normal. GCS eye subscore is 4. GCS verbal subscore is 5. GCS motor subscore is 6.  The patient is mildly confused but alert. Her speech is clear. She does not have any focal extremity disuse. Her daughter however helps her extensively and transferring and moving about.  Skin: Skin is warm, dry and intact.  Psychiatric: She has a  normal mood and affect.        ED Course  Procedures (including critical care time) Labs Review Labs Reviewed - No data to display  Imaging Review No results found.   EKG Interpretation None      MDM   Final diagnoses:  Cellulitis of right upper extremity  Alzheimer's dementia   At this point in time it appears the patient has a soft tissue related injury which has some qualities of  cellulitis. The area is tender and the erythema is warm. It is possible that this is a pressure related injury from having lain in a certain position for several hours. At this point time there is no indication of underlying bony abnormality based on normal range of motion and otherwise normal neurovascular examination arm. Patient does not have any other associated injuries. At this point I will opt for them. Antibiotic therapy with some warmth, swelling and pain present.    Arby Barrette, MD 10/31/14 [phone]

## 2014-10-31 NOTE — Discharge Instructions (Signed)

## 2014-11-02 ENCOUNTER — Other Ambulatory Visit: Payer: Commercial Managed Care - HMO

## 2014-11-10 ENCOUNTER — Ambulatory Visit: Payer: Commercial Managed Care - HMO

## 2014-12-04 DIAGNOSIS — Z66 Do not resuscitate: Secondary | ICD-10-CM | POA: Diagnosis present

## 2014-12-04 DIAGNOSIS — Z515 Encounter for palliative care: Secondary | ICD-10-CM

## 2014-12-04 DIAGNOSIS — J948 Other specified pleural conditions: Secondary | ICD-10-CM | POA: Diagnosis present

## 2014-12-04 DIAGNOSIS — F0280 Dementia in other diseases classified elsewhere without behavioral disturbance: Secondary | ICD-10-CM | POA: Diagnosis present

## 2014-12-04 DIAGNOSIS — E872 Acidosis: Secondary | ICD-10-CM | POA: Diagnosis present

## 2014-12-04 DIAGNOSIS — R451 Restlessness and agitation: Secondary | ICD-10-CM | POA: Diagnosis present

## 2014-12-04 DIAGNOSIS — J189 Pneumonia, unspecified organism: Secondary | ICD-10-CM | POA: Diagnosis present

## 2014-12-04 DIAGNOSIS — R0682 Tachypnea, not elsewhere classified: Secondary | ICD-10-CM | POA: Diagnosis not present

## 2014-12-04 DIAGNOSIS — I7411 Embolism and thrombosis of thoracic aorta: Secondary | ICD-10-CM | POA: Diagnosis present

## 2014-12-04 DIAGNOSIS — R109 Unspecified abdominal pain: Secondary | ICD-10-CM | POA: Diagnosis present

## 2014-12-04 DIAGNOSIS — A419 Sepsis, unspecified organism: Secondary | ICD-10-CM | POA: Diagnosis not present

## 2014-12-04 DIAGNOSIS — N179 Acute kidney failure, unspecified: Secondary | ICD-10-CM | POA: Diagnosis present

## 2014-12-04 DIAGNOSIS — I509 Heart failure, unspecified: Secondary | ICD-10-CM | POA: Diagnosis present

## 2014-12-04 DIAGNOSIS — K567 Ileus, unspecified: Secondary | ICD-10-CM | POA: Diagnosis present

## 2014-12-04 DIAGNOSIS — G309 Alzheimer's disease, unspecified: Secondary | ICD-10-CM | POA: Diagnosis present

## 2014-12-04 DIAGNOSIS — D735 Infarction of spleen: Secondary | ICD-10-CM | POA: Diagnosis present

## 2014-12-04 DIAGNOSIS — K763 Infarction of liver: Secondary | ICD-10-CM | POA: Diagnosis present

## 2014-12-05 ENCOUNTER — Other Ambulatory Visit (HOSPITAL_COMMUNITY): Payer: Self-pay

## 2014-12-05 ENCOUNTER — Inpatient Hospital Stay (HOSPITAL_COMMUNITY)
Admission: EM | Admit: 2014-12-05 | Discharge: 2014-12-25 | DRG: 871 | Disposition: E | Payer: Commercial Managed Care - HMO | Attending: Internal Medicine | Admitting: Internal Medicine

## 2014-12-05 ENCOUNTER — Emergency Department (HOSPITAL_COMMUNITY): Payer: Commercial Managed Care - HMO

## 2014-12-05 ENCOUNTER — Encounter (HOSPITAL_COMMUNITY): Payer: Self-pay | Admitting: Emergency Medicine

## 2014-12-05 DIAGNOSIS — K763 Infarction of liver: Secondary | ICD-10-CM | POA: Diagnosis present

## 2014-12-05 DIAGNOSIS — J948 Other specified pleural conditions: Secondary | ICD-10-CM | POA: Diagnosis present

## 2014-12-05 DIAGNOSIS — Z66 Do not resuscitate: Secondary | ICD-10-CM | POA: Diagnosis present

## 2014-12-05 DIAGNOSIS — D735 Infarction of spleen: Secondary | ICD-10-CM | POA: Diagnosis present

## 2014-12-05 DIAGNOSIS — F0280 Dementia in other diseases classified elsewhere without behavioral disturbance: Secondary | ICD-10-CM | POA: Diagnosis present

## 2014-12-05 DIAGNOSIS — I959 Hypotension, unspecified: Secondary | ICD-10-CM | POA: Diagnosis present

## 2014-12-05 DIAGNOSIS — I50810 Right heart failure, unspecified: Secondary | ICD-10-CM

## 2014-12-05 DIAGNOSIS — Z515 Encounter for palliative care: Secondary | ICD-10-CM | POA: Diagnosis not present

## 2014-12-05 DIAGNOSIS — I7411 Embolism and thrombosis of thoracic aorta: Secondary | ICD-10-CM

## 2014-12-05 DIAGNOSIS — E872 Acidosis, unspecified: Secondary | ICD-10-CM | POA: Diagnosis present

## 2014-12-05 DIAGNOSIS — K567 Ileus, unspecified: Secondary | ICD-10-CM | POA: Diagnosis present

## 2014-12-05 DIAGNOSIS — N179 Acute kidney failure, unspecified: Secondary | ICD-10-CM | POA: Diagnosis present

## 2014-12-05 DIAGNOSIS — R109 Unspecified abdominal pain: Secondary | ICD-10-CM | POA: Insufficient documentation

## 2014-12-05 DIAGNOSIS — R0682 Tachypnea, not elsewhere classified: Secondary | ICD-10-CM | POA: Insufficient documentation

## 2014-12-05 DIAGNOSIS — G309 Alzheimer's disease, unspecified: Secondary | ICD-10-CM

## 2014-12-05 DIAGNOSIS — A419 Sepsis, unspecified organism: Secondary | ICD-10-CM | POA: Diagnosis present

## 2014-12-05 DIAGNOSIS — J189 Pneumonia, unspecified organism: Secondary | ICD-10-CM | POA: Diagnosis present

## 2014-12-05 DIAGNOSIS — I7410 Embolism and thrombosis of unspecified parts of aorta: Secondary | ICD-10-CM | POA: Insufficient documentation

## 2014-12-05 DIAGNOSIS — R1084 Generalized abdominal pain: Secondary | ICD-10-CM

## 2014-12-05 DIAGNOSIS — I509 Heart failure, unspecified: Secondary | ICD-10-CM | POA: Diagnosis present

## 2014-12-05 DIAGNOSIS — R451 Restlessness and agitation: Secondary | ICD-10-CM | POA: Diagnosis present

## 2014-12-05 DIAGNOSIS — I9589 Other hypotension: Secondary | ICD-10-CM

## 2014-12-05 DIAGNOSIS — R000 Tachycardia, unspecified: Secondary | ICD-10-CM | POA: Diagnosis present

## 2014-12-05 LAB — I-STAT CG4 LACTIC ACID, ED: LACTIC ACID, VENOUS: 12.81 mmol/L — AB (ref 0.5–2.0)

## 2014-12-05 LAB — HEPATIC FUNCTION PANEL
ALT: 40 U/L — AB (ref 0–35)
AST: 67 U/L — AB (ref 0–37)
Albumin: 2.3 g/dL — ABNORMAL LOW (ref 3.5–5.2)
Alkaline Phosphatase: 102 U/L (ref 39–117)
BILIRUBIN INDIRECT: 0.3 mg/dL (ref 0.3–0.9)
Bilirubin, Direct: 0.2 mg/dL (ref 0.0–0.5)
Total Bilirubin: 0.5 mg/dL (ref 0.3–1.2)
Total Protein: 5.9 g/dL — ABNORMAL LOW (ref 6.0–8.3)

## 2014-12-05 LAB — BASIC METABOLIC PANEL
ANION GAP: 24 — AB (ref 5–15)
BUN: 27 mg/dL — ABNORMAL HIGH (ref 6–23)
CO2: 12 mmol/L — ABNORMAL LOW (ref 19–32)
CREATININE: 1.35 mg/dL — AB (ref 0.50–1.10)
Calcium: 9.3 mg/dL (ref 8.4–10.5)
Chloride: 103 mmol/L (ref 96–112)
GFR calc non Af Amer: 35 mL/min — ABNORMAL LOW (ref >90)
GFR, EST AFRICAN AMERICAN: 41 mL/min — AB (ref >90)
GLUCOSE: 176 mg/dL — AB (ref 70–99)
POTASSIUM: 3.7 mmol/L (ref 3.5–5.1)
Sodium: 139 mmol/L (ref 135–145)

## 2014-12-05 LAB — I-STAT TROPONIN, ED: Troponin i, poc: 0.04 ng/mL (ref 0.00–0.08)

## 2014-12-05 LAB — I-STAT ARTERIAL BLOOD GAS, ED
ACID-BASE DEFICIT: 11 mmol/L — AB (ref 0.0–2.0)
BICARBONATE: 14.3 meq/L — AB (ref 20.0–24.0)
O2 Saturation: 89 %
PCO2 ART: 28.6 mmHg — AB (ref 35.0–45.0)
PH ART: 7.306 — AB (ref 7.350–7.450)
Patient temperature: 98.7
TCO2: 15 mmol/L (ref 0–100)
pO2, Arterial: 61 mmHg — ABNORMAL LOW (ref 80.0–100.0)

## 2014-12-05 LAB — PROTIME-INR
INR: 1.25 (ref 0.00–1.49)
Prothrombin Time: 15.9 seconds — ABNORMAL HIGH (ref 11.6–15.2)

## 2014-12-05 LAB — CBC
HEMATOCRIT: 43 % (ref 36.0–46.0)
Hemoglobin: 13.5 g/dL (ref 12.0–15.0)
MCH: 29.2 pg (ref 26.0–34.0)
MCHC: 31.4 g/dL (ref 30.0–36.0)
MCV: 93.1 fL (ref 78.0–100.0)
Platelets: 239 10*3/uL (ref 150–400)
RBC: 4.62 MIL/uL (ref 3.87–5.11)
RDW: 14.2 % (ref 11.5–15.5)
WBC: 7.8 10*3/uL (ref 4.0–10.5)

## 2014-12-05 LAB — BRAIN NATRIURETIC PEPTIDE: B Natriuretic Peptide: 187.2 pg/mL — ABNORMAL HIGH (ref 0.0–100.0)

## 2014-12-05 LAB — LIPASE, BLOOD: Lipase: 17 U/L (ref 11–59)

## 2014-12-05 MED ORDER — VANCOMYCIN HCL IN DEXTROSE 1-5 GM/200ML-% IV SOLN
1000.0000 mg | Freq: Once | INTRAVENOUS | Status: AC
  Administered 2014-12-05: 1000 mg via INTRAVENOUS
  Filled 2014-12-05: qty 200

## 2014-12-05 MED ORDER — PIPERACILLIN-TAZOBACTAM 3.375 G IVPB: 3.3750 g | Freq: Three times a day (TID) | INTRAVENOUS | Status: DC

## 2014-12-05 MED ORDER — IOHEXOL 300 MG/ML  SOLN
80.0000 mL | Freq: Once | INTRAMUSCULAR | Status: AC | PRN
  Administered 2014-12-05: 80 mL via INTRAVENOUS

## 2014-12-05 MED ORDER — FENTANYL CITRATE 0.05 MG/ML IJ SOLN
50.0000 ug | Freq: Once | INTRAMUSCULAR | Status: AC
  Administered 2014-12-05: 50 ug via INTRAVENOUS
  Filled 2014-12-05: qty 2

## 2014-12-05 MED ORDER — LORAZEPAM 2 MG/ML IJ SOLN: 1.0000 mg | INTRAMUSCULAR | Status: DC | PRN

## 2014-12-05 MED ORDER — SODIUM CHLORIDE 0.9 % IJ SOLN: 3.0000 mL | Freq: Two times a day (BID) | INTRAMUSCULAR | Status: DC

## 2014-12-05 MED ORDER — ACETAMINOPHEN 325 MG PO TABS: 650.0000 mg | ORAL_TABLET | Freq: Four times a day (QID) | ORAL | Status: DC | PRN

## 2014-12-05 MED ORDER — ONDANSETRON HCL 4 MG/2ML IJ SOLN: 4.0000 mg | Freq: Four times a day (QID) | INTRAMUSCULAR | Status: DC | PRN

## 2014-12-05 MED ORDER — SODIUM CHLORIDE 0.9 % IV SOLN: 250.0000 mL | INTRAVENOUS | Status: DC | PRN

## 2014-12-05 MED ORDER — PIPERACILLIN-TAZOBACTAM 3.375 G IVPB 30 MIN
3.3750 g | Freq: Once | INTRAVENOUS | Status: AC
  Administered 2014-12-05: 3.375 g via INTRAVENOUS
  Filled 2014-12-05: qty 50

## 2014-12-05 MED ORDER — FENTANYL CITRATE 0.05 MG/ML IJ SOLN
100.0000 ug | Freq: Once | INTRAMUSCULAR | Status: AC
  Administered 2014-12-05: 100 ug via INTRAVENOUS
  Filled 2014-12-05: qty 2

## 2014-12-05 MED ORDER — VANCOMYCIN HCL 500 MG IV SOLR: 500.0000 mg | INTRAVENOUS | Status: DC

## 2014-12-05 MED ORDER — SODIUM CHLORIDE 0.9 % IV BOLUS (SEPSIS)
500.0000 mL | Freq: Once | INTRAVENOUS | Status: AC
  Administered 2014-12-05: 500 mL via INTRAVENOUS

## 2014-12-05 MED ORDER — ONDANSETRON HCL 4 MG PO TABS: 4.0000 mg | ORAL_TABLET | Freq: Four times a day (QID) | ORAL | Status: DC | PRN

## 2014-12-05 MED ORDER — SODIUM CHLORIDE 0.9 % IV BOLUS (SEPSIS)
1000.0000 mL | INTRAVENOUS | Status: AC
  Administered 2014-12-05 (×2): 1000 mL via INTRAVENOUS

## 2014-12-05 MED ORDER — ACETAMINOPHEN 650 MG RE SUPP: 650.0000 mg | Freq: Four times a day (QID) | RECTAL | Status: DC | PRN

## 2014-12-05 MED ORDER — LORAZEPAM 2 MG/ML IJ SOLN
1.0000 mg | Freq: Once | INTRAMUSCULAR | Status: AC
  Administered 2014-12-05: 1 mg via INTRAVENOUS
  Filled 2014-12-05: qty 1

## 2014-12-05 MED ORDER — SODIUM CHLORIDE 0.9 % IJ SOLN: 3.0000 mL | INTRAMUSCULAR | Status: DC | PRN

## 2014-12-05 MED ORDER — HYDROMORPHONE HCL 1 MG/ML IJ SOLN
0.5000 mg | Freq: Once | INTRAMUSCULAR | Status: AC
  Administered 2014-12-05: 0.5 mg via INTRAVENOUS
  Filled 2014-12-05: qty 1

## 2014-12-05 MED ORDER — MORPHINE SULFATE 2 MG/ML IJ SOLN
1.0000 mg | INTRAMUSCULAR | Status: DC | PRN
  Administered 2014-12-05 (×2): 2 mg via INTRAVENOUS
  Filled 2014-12-05 (×2): qty 1

## 2014-12-05 MED ORDER — MORPHINE SULFATE 25 MG/ML IV SOLN
5.0000 mg/h | INTRAVENOUS | Status: DC
  Filled 2014-12-05: qty 10

## 2014-12-11 ENCOUNTER — Ambulatory Visit: Payer: Commercial Managed Care - HMO | Admitting: Podiatry

## 2014-12-11 LAB — CULTURE, BLOOD (ROUTINE X 2)
CULTURE: NO GROWTH
Culture: NO GROWTH

## 2014-12-25 NOTE — Progress Notes (Signed)
ANTIBIOTIC CONSULT NOTE - INITIAL  Pharmacy Consult for Vancomycin and Zosyn Indication: Sepsis   No Known Allergies  Patient Measurements: Height: 5\' 4"  (162.6 cm) Weight: 100 lb (45.36 kg) IBW/kg (Calculated) : 54.7   Vital Signs: Temp: 100.3 F (37.9 C) (03/12 0050) Temp Source: Rectal (03/12 0050) BP: 123/87 mmHg (03/12 0050) Pulse Rate: 130 (03/12 0050) Intake/Output from previous day:   Intake/Output from this shift:    Labs:  Recent Labs  03/05/15 0030  WBC 7.8  HGB 13.5  PLT 239  CREATININE 1.35*   Estimated Creatinine Clearance: 22.2 mL/min (by C-G formula based on Cr of 1.35). No results for input(s): VANCOTROUGH, VANCOPEAK, VANCORANDOM, GENTTROUGH, GENTPEAK, GENTRANDOM, TOBRATROUGH, TOBRAPEAK, TOBRARND, AMIKACINPEAK, AMIKACINTROU, AMIKACIN in the last 72 hours.   Microbiology: No results found for this or any previous visit (from the past 720 hour(s)).  Medical History: Past Medical History  Diagnosis Date  . Alzheimer disease     Assessment: 81 y.o female with h/o Alzheimer's disease  who presents to the ED for tachypnea and abdominal pain. Sepsis protocol initiated. Likely intra-abdominal source.  Zosyn 3.375 gm IV and vancomycin 1000 mg IV given in ED ~ 01:00.  SCr = 1.35, estimated CrCl ~ 22 ml/min, weight 45.4 kg  Goal of Therapy:  Vancomycin trough level 15-20 mcg/ml  Plan:  Zosyn 3.375 gm IV q8h (infuse each dose over 4 hours), adjust dose if CrCl falls <20 ml/min Vancomycin 500mg  IV q24h (next dose due 3/13 @ 1AM Monitor clinical status, renal function, cultures results daily.   Noah Delaineuth Jamicah Anstead, RPh Clinical Pharmacist Pager: [phone]  2015-03-30,1:12 AM

## 2014-12-25 NOTE — ED Notes (Addendum)
Daughter reports pt has been agitated today since 3pm.  History of Alzheimers disease.  Pt is normally non-verbal since November.  Pt moaning and has increased respirations.  Daughter states she seemed to be in more pain when she palpated her abd.

## 2014-12-25 NOTE — Progress Notes (Signed)
Patient admitted this AM. I have reviewed the history, examined the patient and spoken with family in room. She is comfort care only. She is restless and moaning and has been this was since admission per the family member. It did not improve with Fentanyl or Morphine. He is ok with giving her a dose of Ativan to help her rest as she has not slept all night.   Calvert CantorSaima Patrina Andreas, MD

## 2014-12-25 NOTE — ED Notes (Signed)
Pt's family requesting pain meds for pt.  Per outgoing RN, EDP is aware.  No orders at this time.

## 2014-12-25 NOTE — ED Provider Notes (Signed)
CSN: [id number]     Arrival date & time 12/04/14  2356 History  This chart was scribed for Loren Racer, MD by Bronson Curb, ED Scribe. This patient was seen in room D36C/D36C and the patient's care was started at 12:23 AM.     Chief Complaint  Patient presents with  . Shortness of Breath   LEVEL 5 CAVEAT: PATIENT NONVERBAL  History provided by: daughter. No language interpreter was used.     HPI Comments: AUDRY KAUZLARICH is a 81 y.o. female who presents to the Emergency Department for tachypnea and abdominal pain. Per daughter, patient has history of Alzheimer's Disease and is nonverbal at baseline. Daughter states the was doing well and asymptomatic throughout the day. She reports the patient started moaning approximately 9 hours ago, but did not believe this to be concerning until the moaning persisted. She notes the patient seemed to be in pain when she palpated her abdomen. Daughter states the patient is eating, and ate a few bites of salad and drank some Gatorade PTA. Daughter report large BMs every "couple of days" with the last BM being yesterday. Patient was given Pepto Bismol PTA. Daughter reports the patient was vomiting and has had very loose bowels in the last 2 days, but none today. She denies history of abdominal surgery.   Past Medical History  Diagnosis Date  . Alzheimer disease    History reviewed. No pertinent past surgical history. No family history on file. History  Substance Use Topics  . Smoking status: Never Smoker   . Smokeless tobacco: Not on file  . Alcohol Use: No   OB History    No data available     Review of Systems  Unable to perform ROS: Patient nonverbal      Allergies  Review of patient's allergies indicates no known allergies.  Home Medications   Prior to Admission medications   Medication Sig Start Date End Date Taking? Authorizing Provider  amoxicillin-clavulanate (AUGMENTIN) 400-57 MG/5ML suspension Take 5 mLs (400 mg  total) by mouth 3 (three) times daily. Patient not taking: Reported on 12/08/2014 10/31/14   Arby Barrette, MD  polyethylene glycol (MIRALAX / GLYCOLAX) packet Take 17 g by mouth daily. Patient not taking: Reported on 2014/12/08 02/22/14   Loren Racer, MD   BP 94/53 mmHg  Pulse 120  Temp(Src) 100.3 F (37.9 C) (Rectal)  Resp 29  Ht 5\' 4"  (1.626 m)  Wt 105 lb 5 oz (47.769 kg)  BMI 18.07 kg/m2  SpO2 99% Physical Exam  Constitutional: She appears well-developed and well-nourished. She appears distressed.  Tachypnea and  moaning  HENT:  Head: Normocephalic and atraumatic.  Eyes: Conjunctivae and EOM are normal. Pupils are equal, round, and reactive to light.  Neck: Normal range of motion. Neck supple. No tracheal deviation present.  No meningismus  Cardiovascular:  tachycardia  Pulmonary/Chest: No respiratory distress. She has no rales.  tachypnea  Abdominal: She exhibits distension. There is tenderness.  Decreased bowel sounds.  Abdomen is distended and firm. Apparent discomfort with palpation  Musculoskeletal: Normal range of motion.  No pitting edema. No appreciated calf tenderness  Neurological: She is alert.  Intermittently following commands. Moving all extremities  Skin: Skin is dry.  Distal extremities are cool to touch  Psychiatric: She has a normal mood and affect. Her behavior is normal.  Nursing note and vitals reviewed.   ED Course  Procedures (including critical care time)  DIAGNOSTIC STUDIES: .    COORDINATION OF CARE: At [facility]  Discussed treatment plan with daughter. Daughter agrees.   Labs Review Labs Reviewed  BASIC METABOLIC PANEL - Abnormal; Notable for the following:    CO2 12 (*)    Glucose, Bld 176 (*)    BUN 27 (*)    Creatinine, Ser 1.35 (*)    GFR calc non Af Amer 35 (*)    GFR calc Af Amer 41 (*)    Anion gap 24 (*)    All other components within normal limits  BRAIN NATRIURETIC PEPTIDE - Abnormal; Notable for the following:    B  Natriuretic Peptide 187.2 (*)    All other components within normal limits  HEPATIC FUNCTION PANEL - Abnormal; Notable for the following:    Total Protein 5.9 (*)    Albumin 2.3 (*)    AST 67 (*)    ALT 40 (*)    All other components within normal limits  PROTIME-INR - Abnormal; Notable for the following:    Prothrombin Time 15.9 (*)    All other components within normal limits  I-STAT CG4 LACTIC ACID, ED - Abnormal; Notable for the following:    Lactic Acid, Venous 12.81 (*)    All other components within normal limits  I-STAT ARTERIAL BLOOD GAS, ED - Abnormal; Notable for the following:    pH, Arterial 7.306 (*)    pCO2 arterial 28.6 (*)    pO2, Arterial 61.0 (*)    Bicarbonate 14.3 (*)    Acid-base deficit 11.0 (*)    All other components within normal limits  CULTURE, BLOOD (ROUTINE X 2)  CULTURE, BLOOD (ROUTINE X 2)  CBC  LIPASE, BLOOD  URINALYSIS, ROUTINE W REFLEX MICROSCOPIC  BLOOD GAS, ARTERIAL  I-STAT TROPOININ, ED    Imaging Review Ct Angio Chest Pe W/cm &/or Wo Cm  Dec 22, 2014   CLINICAL DATA:  81 year old female with tachypnea and abdominal pain. Hydro pneumothorax on radiograph.  EXAM: CT ANGIOGRAPHY CHEST WITH CONTRAST  TECHNIQUE: Multidetector CT imaging of the chest was performed using the standard protocol during bolus administration of intravenous contrast. Multiplanar CT image reconstructions and MIPs were obtained to evaluate the vascular anatomy.  CONTRAST:  80mL OMNIPAQUE IOHEXOL 300 MG/ML  SOLN  COMPARISON:  Chest radiograph earlier this day.  FINDINGS: There are no filling defects within the pulmonary arteries to suggest pulmonary embolus.  As seen on chest radiograph there is a loculated right hydropneumothorax. Pneumothorax is primarily anterior inferiorly and measures up to 14 mm. Right pleural effusion measures simple fluid density in measures up to 4.2 cm in depth, primarily lateral basal. Scattered atelectasis throughout the right lower and middle lobe,  with compressive atelectasis adjacent to pleural effusion. Question but not definitive nondisplaced fracture right lateral eighth rib. There is no significant mediastinal shift.  The heart is upper normal in size. Coronary artery calcifications are seen. There is atherosclerosis of normal caliber thoracic aorta. This involves calcified and noncalcified atheromatous plaque. Mild contrast refluxing into the hepatic veins and IVC. There is no pericardial effusion. No left pleural effusion.  Esophagus is dilated throughout its course. There is moderate esophageal wall thickening distally.  Linear atelectasis in the lingula. Left lung is otherwise clear. Multilevel degenerative change throughout the thoracic spine.  Review of the MIP images confirms the above findings.  IMPRESSION: 1. No pulmonary embolus. 2. Moderate partially loculated right hydropneumothorax, as seen on prior radiograph. Pleural fluid measure simple fluid density. Adjacent atelectasis in the right hemithorax. There is a questionable but not definitive on displaced fracture  of the right lateral eighth rib. 3. Dilated esophagus throughout, diffuse esophageal thickening distally. This can be seen in the setting of esophagitis, however neoplastic involvement is not excluded. 4. Atherosclerosis including coronary artery calcifications. 5. Contrast refluxing into the hepatic veins and IVC, can be seen in the setting of right heart failure.   Electronically Signed   By: Rubye OaksMelanie  Ehinger M.D.   On: 2015-02-25 02:40   Ct Abdomen Pelvis W Contrast  Mar 05, 2015   CLINICAL DATA:  Abdominal pain and tenderness to palpation.  EXAM: CT ABDOMEN AND PELVIS WITH CONTRAST  TECHNIQUE: Multidetector CT imaging of the abdomen and pelvis was performed using the standard protocol following bolus administration of intravenous contrast.  CONTRAST:  80mL OMNIPAQUE IOHEXOL 300 MG/ML  SOLN  COMPARISON:  CT abdomen 02/22/2014. Chest radiographs earlier this day. Chest CT  performed concurrently.  FINDINGS: Right basilar hydro pneumothorax, assessed on dedicated chest CT.  Motion limited exam.  There are 2 lesions within the liver, these are with new from prior exam. Larger lesion is subcapsular in the lower right hepatic lobe measuring up to 5.5 x 3.3 cm. This extends to the liver capsule. More superiorly there is a is subcapsular 1.8 x 1.1 cm hypodensity. Vessels are seen running through these lesions. These are less well appreciated on delayed phase imaging.  The gallbladder is physiologically distended.  There are questionable wedge shaped hypodensities within the spleen. This is suboptimally assessed due to motion and arms down positioning.  The kidneys demonstrate symmetric enhancement and excretion. No hydronephrosis.  Pancreas is atrophic. Mild fullness and hyper enhancement of both adrenal glands without discrete nodule.  There is thrombus within the infrarenal abdominal aorta spanning 4.2 cm in cranial caudal dimension causing near complete occlusion and luminal narrowing of the infrarenal portion. A thin rim lumen remains patent. The distal abdominal aorta is patent.  The stomach is distended with fluid. Bowel assessment is limited given lack of contrast and motion. There are fluid-filled bowel loops in the pelvis. Moderate volume of colonic stool. There is gaseous distention of transverse colon. No definite free air in the abdomen. No intra-abdominal ascites.  Within the pelvis the bladder is physiologically distended. The uterus remains in situ. No gross adnexal mass.  Scoliotic curvature and severe degenerative change throughout spine. Mild compression deformity superior endplate of L2. No acute osseous abnormality.  IMPRESSION: 1. Noncalcified thrombus within the infrarenal abdominal aorta causing near complete occlusion, thin peripheral lumen remains patent. This is new from prior exam. 2. New peripheral hypodensities within the liver, largest in the inferior right  hepatic lobe measuring 5.5 cm. There are vessels running through both of these hypodensities. Suspect hepatic infarct, metastatic disease versus perfusion variation also considered. Additionally there wedge shaped hypodensities in the spleen, concerning for splenic infarcts. 3. Fluid-filled small bowel loops in the pelvis. There is mild gaseous distention of transverse colon. This may be reactive ileus, however is nonspecific. These results were called by telephone at the time of interpretation on Mar 05, 2015 at 3:00 am to Dr. Loren RacerAVID Merlene Dante , who verbally acknowledged these results.   Electronically Signed   By: Rubye OaksMelanie  Ehinger M.D.   On: 2015-02-25 03:04   Dg Chest Port 1 View  Mar 05, 2015   CLINICAL DATA:  Shortness of breath. History of Alzheimer's disease.  EXAM: PORTABLE CHEST - 1 VIEW  COMPARISON:  Chest radiograph Feb 22, 2014  FINDINGS: Small to moderate RIGHT pneumothorax, measuring up to 15mm from the chest wall. No mediastinal shift. RIGHT lung  base consolidation. Mild chronic interstitial changes appear cardiac silhouette is normal size, mildly calcified aortic knob. Patient is osteopenic without rib fracture deformity.  IMPRESSION: Small to moderate RIGHT hydro pneumothorax without mediastinal shift. RIGHT lung base consolidation, nonspecific. Recommend followup.  Acute findings discussed with and reconfirmed by Dr.Lurine Imel on 12/07/14 at 1:10 am.   Electronically Signed   By: Awilda Metro   On: 12/07/2014 01:18     EKG Interpretation None     CRITICAL CARE Performed by: Ranae Palms, Kambri Dismore Total critical care time: 50 min Critical care time was exclusive of separately billable procedures and treating other patients. Critical care was necessary to treat or prevent imminent or life-threatening deterioration. Critical care was time spent personally by me on the following activities: development of treatment plan with patient and/or surrogate as well as nursing, discussions with  consultants, evaluation of patient's response to treatment, examination of patient, obtaining history from patient or surrogate, ordering and performing treatments and interventions, ordering and review of laboratory studies, ordering and review of radiographic studies, pulse oximetry and re-evaluation of patient's condition.  MDM   Final diagnoses:  Abdominal pain  Tachypnea  Hydropneumothorax  Aortic embolism or thrombosis  Lactic acidosis    I personally performed the services described in this documentation, which was scribed in my presence. The recorded information has been reviewed and is accurate.  Sepsis protocol initiated. Likely intra-abdominal source. Discussed with patient's daughter. Patient has no living will. I discussed the gravity of the situation and daughter states she will discuss with rest family.   Discuss CT results with general surgery who recommended consult thoracic and vascular surgery. Discussed with Dr Tyrone Sage and Dr Darrick Penna. Both will see in ED. Discussed with Critical care. Will see in ED and admit.   Pt with transient hypotension with IV dilaudid. Improved with IV fluids.    Critical-care discuss at length with patient's daughter and son. Family decided to make the patient comfort care only. Will discuss with hospitalist about bringing in for comfort care. Will inform vascular and thoracic surgeons of family's wishes.  Loren Racer, MD 12/06/14 [phone]

## 2014-12-25 NOTE — ED Notes (Signed)
Pt's BP dropped after Dilaudid dose.  Trending back up slowly.  Will continue to monitor.

## 2014-12-25 NOTE — Progress Notes (Signed)
Pt arrived on unit at 1900 via stretcher. Family member at bedside. Pt is tachypnic.report given to oncoming nurse.

## 2014-12-25 NOTE — H&P (Signed)
[facility] Admission History and Physical       Yolanda Ray NLZ:[id number] DOB: 1928-08-22 DOA: December 20, 2014  Referring physician: EDP PCP: Ginette Otto, MD  Specialists:   Chief Complaint: ABD Pain   HPI: Yolanda Ray is a 81 y.o. female with a history of Alzheimer's Disease who was brought to the ED due to sudden onset at 9 PM of moaning and complaints of pain.   In the ED she was evaluated and was a found to have a tender ABD, and a CTA of the Chest and CT scan of the ABD were performed and revealed a Loculated Right Lung Hydropneumothorax, RLL Consolidation,Rigth Heart Failure,  both hepatic and Splenic Infarcts versus Metastatic Disease, Dilaltion of the Esophagus, and Reactive Ileus,   Review of Systems: Unable to Obtain from the Patient  Past Medical History  Diagnosis Date  . Alzheimer disease      History reviewed. No pertinent past surgical history.    Prior to Admission medications   Medication Sig Start Date End Date Taking? Authorizing Provider  amoxicillin-clavulanate (AUGMENTIN) 400-57 MG/5ML suspension Take 5 mLs (400 mg total) by mouth 3 (three) times daily. Patient not taking: Reported on December 20, 2014 10/31/14   Arby Barrette, MD  polyethylene glycol (MIRALAX / GLYCOLAX) packet Take 17 g by mouth daily. Patient not taking: Reported on Dec 20, 2014 02/22/14   Loren Racer, MD     No Known Allergies  Social History:  Lives with Daughter her Primary Caretaker   reports that she has never smoked. She does not have any smokeless tobacco history on file. She reports that she does not drink alcohol or use illicit drugs.    No family history on file.     Physical Exam:  GEN:  Elderly Frail  81 y.o. Caucasian female examined and in discomfort and acute distress;  Filed Vitals:   2014/12/20 0515 12-20-2014 0530 December 20, 2014 0545 12/20/2014 0615  BP: 80/37  Pulse: 119 122 123 125  Temp:      TempSrc:      Resp: 36 18 32 34    Height:      Weight:      SpO2: 98% 97% 99% 99%   Blood pressure 80/37, pulse 125, temperature 100.3 F (37.9 C), temperature source Rectal, resp. rate 34, height  (1.626 m), weight 47.769 kg (105 lb 5 oz), SpO2 99 %. PSYCH: She is alert and oriented x1; does not appear anxious does not appear depressed; affect is normal HEENT: Normocephalic and Atraumatic, Mucous membranes pink; PERRLA; EOM intact; Fundi:  Benign;  No scleral icterus, Nares: Patent, Oropharynx: Clear,     Neck:  FROM, No Cervical Lymphadenopathy nor Thyromegaly or Carotid Bruit; No JVD; Breasts:: Not examined CHEST WALL: No tenderness CHEST: Tachypneic respiration, Decreased Breath Sounds Right lung  HEART: Tachycardic Regular rhythm; no murmurs rubs or gallops BACK: No kyphosis or scoliosis; No CVA tenderness ABDOMEN: Positive Bowel Sounds, Firm, diffusely Tender, No Rebound or Guarding; No Masses, No Organomegaly Rectal Exam: Not done EXTREMITIES: No Cyanosis, Clubbing, or Edema; No Ulcerations. Genitalia: not examined PULSES: 2+ and symmetric SKIN: Normal hydration no rash or ulceration CNS:  Alert and Oriented x 1, Obtunded, Generalized [place] Vascular: pulses palpable throughout    Labs on Admission:  Basic Metabolic Panel:  Recent Labs Lab December 20, 2014 0030  NA 139  K 3.7  CL 103  CO2 12*  GLUCOSE 176*  BUN 27*  CREATININE 1.35*  CALCIUM 9.3   Liver Function Tests:  Recent Labs Lab 2015/01/03 0035  AST 67*  ALT 40*  ALKPHOS 102  BILITOT 0.5  PROT 5.9*  ALBUMIN 2.3*    Recent Labs Lab 2015-01-03 0035  LIPASE 17   No results for input(s): AMMONIA in the last 168 hours. CBC:  Recent Labs Lab 2015-01-03 0030  WBC 7.8  HGB 13.5  HCT 43.0  MCV 93.1  PLT 239   Cardiac Enzymes: No results for input(s): CKTOTAL, CKMB, CKMBINDEX, TROPONINI in the last 168 hours.  BNP (last 3 results)  Recent Labs  2015/01/03 0030  BNP 187.2*    ProBNP (last 3 results) No results for input(s):  PROBNP in the last 8760 hours.  CBG: No results for input(s): GLUCAP in the last 168 hours.  Radiological Exams on Admission: Ct Angio Chest Pe W/cm &/or Wo Cm  2015-01-03   CLINICAL DATA:  81 year old female with tachypnea and abdominal pain. Hydro pneumothorax on radiograph.  EXAM: CT ANGIOGRAPHY CHEST WITH CONTRAST  TECHNIQUE: Multidetector CT imaging of the chest was performed using the standard protocol during bolus administration of intravenous contrast. Multiplanar CT image reconstructions and MIPs were obtained to evaluate the vascular anatomy.  CONTRAST:  80mL OMNIPAQUE IOHEXOL 300 MG/ML  SOLN  COMPARISON:  Chest radiograph earlier this day.  FINDINGS: There are no filling defects within the pulmonary arteries to suggest pulmonary embolus.  As seen on chest radiograph there is a loculated right hydropneumothorax. Pneumothorax is primarily anterior inferiorly and measures up to 14 mm. Right pleural effusion measures simple fluid density in measures up to 4.2 cm in depth, primarily lateral basal. Scattered atelectasis throughout the right lower and middle lobe, with compressive atelectasis adjacent to pleural effusion. Question but not definitive nondisplaced fracture right lateral eighth rib. There is no significant mediastinal shift.  The heart is upper normal in size. Coronary artery calcifications are seen. There is atherosclerosis of normal caliber thoracic aorta. This involves calcified and noncalcified atheromatous plaque. Mild contrast refluxing into the hepatic veins and IVC. There is no pericardial effusion. No left pleural effusion.  Esophagus is dilated throughout its course. There is moderate esophageal wall thickening distally.  Linear atelectasis in the lingula. Left lung is otherwise clear. Multilevel degenerative change throughout the thoracic spine.  Review of the MIP images confirms the above findings.  IMPRESSION: 1. No pulmonary embolus. 2. Moderate partially loculated right  hydropneumothorax, as seen on prior radiograph. Pleural fluid measure simple fluid density. Adjacent atelectasis in the right hemithorax. There is a questionable but not definitive on displaced fracture of the right lateral eighth rib. 3. Dilated esophagus throughout, diffuse esophageal thickening distally. This can be seen in the setting of esophagitis, however neoplastic involvement is not excluded. 4. Atherosclerosis including coronary artery calcifications. 5. Contrast refluxing into the hepatic veins and IVC, can be seen in the setting of right heart failure.   Electronically Signed   By: Rubye Oaks M.D.   On: 01-03-15 02:40   Ct Abdomen Pelvis W Contrast  2015-01-03   CLINICAL DATA:  Abdominal pain and tenderness to palpation.  EXAM: CT ABDOMEN AND PELVIS WITH CONTRAST  TECHNIQUE: Multidetector CT imaging of the abdomen and pelvis was performed using the standard protocol following bolus administration of intravenous contrast.  CONTRAST:  80mL OMNIPAQUE IOHEXOL 300 MG/ML  SOLN  COMPARISON:  CT abdomen 02/22/2014. Chest radiographs earlier this day. Chest CT performed concurrently.  FINDINGS: Right basilar hydro pneumothorax, assessed on dedicated chest CT.  Motion limited exam.  There are 2 lesions within  the liver, these are with new from prior exam. Larger lesion is subcapsular in the lower right hepatic lobe measuring up to 5.5 x 3.3 cm. This extends to the liver capsule. More superiorly there is a is subcapsular 1.8 x 1.1 cm hypodensity. Vessels are seen running through these lesions. These are less well appreciated on delayed phase imaging.  The gallbladder is physiologically distended.  There are questionable wedge shaped hypodensities within the spleen. This is suboptimally assessed due to motion and arms down positioning.  The kidneys demonstrate symmetric enhancement and excretion. No hydronephrosis.  Pancreas is atrophic. Mild fullness and hyper enhancement of both adrenal glands without  discrete nodule.  There is thrombus within the infrarenal abdominal aorta spanning 4.2 cm in cranial caudal dimension causing near complete occlusion and luminal narrowing of the infrarenal portion. A thin rim lumen remains patent. The distal abdominal aorta is patent.  The stomach is distended with fluid. Bowel assessment is limited given lack of contrast and motion. There are fluid-filled bowel loops in the pelvis. Moderate volume of colonic stool. There is gaseous distention of transverse colon. No definite free air in the abdomen. No intra-abdominal ascites.  Within the pelvis the bladder is physiologically distended. The uterus remains in situ. No gross adnexal mass.  Scoliotic curvature and severe degenerative change throughout spine. Mild compression deformity superior endplate of L2. No acute osseous abnormality.  IMPRESSION: 1. Noncalcified thrombus within the infrarenal abdominal aorta causing near complete occlusion, thin peripheral lumen remains patent. This is new from prior exam. 2. New peripheral hypodensities within the liver, largest in the inferior right hepatic lobe measuring 5.5 cm. There are vessels running through both of these hypodensities. Suspect hepatic infarct, metastatic disease versus perfusion variation also considered. Additionally there wedge shaped hypodensities in the spleen, concerning for splenic infarcts. 3. Fluid-filled small bowel loops in the pelvis. There is mild gaseous distention of transverse colon. This may be reactive ileus, however is nonspecific. These results were called by telephone at the time of interpretation on November 29, 2014 at 3:00 am to Dr. Loren RacerAVID YELVERTON , who verbally acknowledged these results.   Electronically Signed   By: Rubye OaksMelanie  Ehinger M.D.   On: 0March 06, 2016 03:04   Dg Chest Port 1 View  November 29, 2014   CLINICAL DATA:  Shortness of breath. History of Alzheimer's disease.  EXAM: PORTABLE CHEST - 1 VIEW  COMPARISON:  Chest radiograph Feb 22, 2014  FINDINGS:  Small to moderate RIGHT pneumothorax, measuring up to 15mm from the chest wall. No mediastinal shift. RIGHT lung base consolidation. Mild chronic interstitial changes appear cardiac silhouette is normal size, mildly calcified aortic knob. Patient is osteopenic without rib fracture deformity.  IMPRESSION: Small to moderate RIGHT hydro pneumothorax without mediastinal shift. RIGHT lung base consolidation, nonspecific. Recommend followup.  Acute findings discussed with and reconfirmed by Dr.DAVID YELVERTON on November 29, 2014 at 1:10 am.   Electronically Signed   By: Awilda Metroourtnay  Bloomer   On: 0March 06, 2016 01:18     EKG: Independently reviewed.        Assessment/Plan:   81 y.o. female with   Active Problems:  MultiSystem Organ Failure>>> Now Comfort Care     1.   Sepsis/  CAP (community acquired pneumonia)   BC x 2 sent   Received 1 dose of Vancomycin and Zosyn        2.   Hydropneumothorax/ Hypotension/ Tachycardia/  Metabolic acidosis/  Right heart failure/Hepatic infarction/ Splenic infarct   Seen By Critical care and discussed with family and made  Comfort Care     3.   Ileus   NPO     4.   Alzheimer's dementia   unchanged     5.   DVT Prophylaxis   SCDS     6.  Comfort Care Measures initiated   Morphine -1-2 mg every 2 hours PRN SOB or Discomfort   Ativan 0.5mg  Iv q 4 hours PRN Agitation   Palliative Care Consultation        Code Status:     DO NOT RESUSCITATE (DNR) / Comfort Care Meaasures  Family Communication:   Family at Bedside    Disposition Plan:    Inpatient  Status        Time spent:  60 Minutes      Ron Parker [facility] Pager [phone]   If 7AM -7PM Please Contact the Day Rounding Team MD for [facility]  If 7PM-7AM, Please Contact Night-Floor Coverage  [email or web address] Password [facility] 12/26/2014, 6:46 AM     ADDENDUM:   Patient was seen and examined on Dec 26, 2014

## 2014-12-25 NOTE — Discharge Summary (Signed)
Death Summary  Fritzi MandesHarriette A Eriksen ZOX:[id number] DOB: 04-Jan-1928 DOA: 2014-10-22  PCP: Ginette OttoSTONEKING,HAL THOMAS, MD  Admit date: 2014-10-22 Date of Death: 2014-10-22  Final Diagnoses:  Active Problems:   Hydropneumothorax   Sepsis   Hypotension   Tachycardia   Metabolic acidosis   Right heart failure   CAP (community acquired pneumonia)   Hepatic infarction   Splenic infarct   Ileus   Alzheimer's dementia   Tachypnea   Abdominal pain   Aortic embolism or thrombosis  Patient admitted early this AM and made comfort care. She expired at 1205.  See H and P for details.  My last note is attached below.    Progress Note: Patient admitted this AM. I have reviewed the history, examined the patient and spoken with family in room. She is comfort care only. She is restless and moaning and has been this was since admission per the family member. It did not improve with Fentanyl or Morphine. He is ok with giving her a dose of Ativan to help her rest as she has not slept all night.   Time: 25 min  Signed:  Jadence Kinlaw  [facility] 2014-10-22, 2:14 PM

## 2014-12-25 NOTE — Progress Notes (Signed)
Report received from Shon HaleAngela Kyker, RN in ED. Awaiting pt's arrival.

## 2014-12-25 NOTE — Consult Note (Addendum)
PULMONARY / CRITICAL CARE MEDICINE   Name: Yolanda Ray MRN: [id number] DOB: 1927-11-22    ADMISSION DATE:  29-Dec-2014 CONSULTATION DATE:  29-Dec-2014  REFERRING MD :  ED  CHIEF COMPLAINT:  Abdominal pain  INITIAL PRESENTATION:   STUDIES:  CT chest and abdomen  SIGNIFICANT EVENTS:   HISTORY OF PRESENT ILLNESS:  76F with advanced dementia, apparently yesterday afternoon started having abdominal pain and dyspnea. In the ED found to have compensated metabolic acidosis with high lactate of 12. Has abdominal aorta thrombus, liver hypodensities, splenic infarcts. Also has hydropneumothorax and possibly a lung mass. Also has thickened esophagus.  PAST MEDICAL HISTORY :   has a past medical history of Alzheimer disease.  has no past surgical history on file. Prior to Admission medications   Medication Sig Start Date End Date Taking? Authorizing Provider  amoxicillin-clavulanate (AUGMENTIN) 400-57 MG/5ML suspension Take 5 mLs (400 mg total) by mouth 3 (three) times daily. Patient not taking: Reported on 12-29-2014 10/31/14   Arby Barrette, MD  polyethylene glycol (MIRALAX / GLYCOLAX) packet Take 17 g by mouth daily. Patient not taking: Reported on 12-29-2014 02/22/14   Loren Racer, MD   No Known Allergies  FAMILY HISTORY:  has no family status information on file.  SOCIAL HISTORY:  reports that she has never smoked. She does not have any smokeless tobacco history on file. She reports that she does not drink alcohol or use illicit drugs.  REVIEW OF SYSTEMS:  Unable to obtain due to advanced dementia  SUBJECTIVE:   VITAL SIGNS: Temp:  [100.3 F (37.9 C)] 100.3 F (37.9 C) (03/12 0050) Pulse Rate:  [115-130] 121 (03/12 0502) Resp:  [16-36] 33 (03/12 0502) BP: (62-143)/(36-100) 92/50 mmHg (03/12 0503) SpO2:  [79 %-100 %] 97 % (03/12 0502) Weight:  [45.36 kg (100 lb)-47.769 kg (105 lb 5 oz)] 47.769 kg (105 lb 5 oz) (03/12 0151) HEMODYNAMICS:   VENTILATOR SETTINGS:   INTAKE  / OUTPUT:  Intake/Output Summary (Last 24 hours) at 2014/12/29 0514 Last data filed at 12/29/14 0215  Gross per 24 hour  Intake   1550 ml  Output      0 ml  Net   1550 ml    PHYSICAL EXAMINATION: General:  In moderate distress Neuro:  No facial droop, moves all extremities HEENT:  No stridor, no trauma Cardiovascular:  RRR, no loud murmur Lungs:  Decreased breath sounds on the right, no wheeze Abdomen:  Soft, marked direct tenderness Musculoskeletal:  No gross deformities Skin:  No edema  LABS:  CBC  Recent Labs Lab December 29, 2014 0030  WBC 7.8  HGB 13.5  HCT 43.0  PLT 239   Coag's  Recent Labs Lab 12/29/14 0105  INR 1.25   BMET  Recent Labs Lab 29-Dec-2014 0030  NA 139  K 3.7  CL 103  CO2 12*  BUN 27*  CREATININE 1.35*  GLUCOSE 176*   Electrolytes  Recent Labs Lab 29-Dec-2014 0030  CALCIUM 9.3   Sepsis Markers  Recent Labs Lab 12-29-2014 0040  LATICACIDVEN 12.81*   ABG  Recent Labs Lab 2014-12-29 0200  PHART 7.306*  PCO2ART 28.6*  PO2ART 61.0*   Liver Enzymes  Recent Labs Lab 12/29/2014 0035  AST 67*  ALT 40*  ALKPHOS 102  BILITOT 0.5  ALBUMIN 2.3*   Cardiac Enzymes No results for input(s): TROPONINI, PROBNP in the last 168 hours. Glucose No results for input(s): GLUCAP in the last 168 hours.  Imaging No results found.   ASSESSMENT / PLAN:  PULMONARY A: compensated metabolic acidosis P:   No acute intervention  CARDIOVASCULAR CVL A: hypotension likely related to cancer and probable ischemic bowel P:  No acute intervention. Comfort care  RENAL A:  Mild AKI P:   Comfort care Encourage PO intake if tolerated  GASTROINTESTINAL A:  Liver infarcts P:   Comfort care  HEMATOLOGIC A:  Splenic infarcts, high likelihood of cancer of unclear primary P:  Comfort care with opioids  INFECTIOUS A:  No acute issue P:   BCx2  UC  Abx:   ENDOCRINE A:   P:     NEUROLOGIC A:  Advanced dementia P:   No acute  intervention Ativan PRN for agitation in the setting of comfort care  FAMILY  - Updates:   - Inter-disciplinary family meet or Palliative Care meeting due    TODAY'S SUMMARY: [age]F with advanced dementia now coming with hydropneumothorax (and possibly a lung mass), abdominal aortic thrombus, liver hypodensities, splenic infarcts, and possible ischemic bowel. After discussion with family members, they think patient would prefer comfort care.  Critical care time: 35 minutes  Pulmonary and Critical Care Medicine [facility] Pager: [phone]  2014/11/26, 5:14 AM

## 2014-12-25 DEATH — deceased

## 2016-02-10 IMAGING — CR DG ABDOMEN ACUTE W/ 1V CHEST
3 series · 3 of 3 positions shown · non-contrast
Comparison: None.

CLINICAL DATA: 85-year-old female with abdominal pain, left upper
quadrant pain. Initial encounter.

EXAM:
ACUTE ABDOMEN SERIES (ABDOMEN 2 VIEW & CHEST 1 VIEW)

[w abdomen decub]
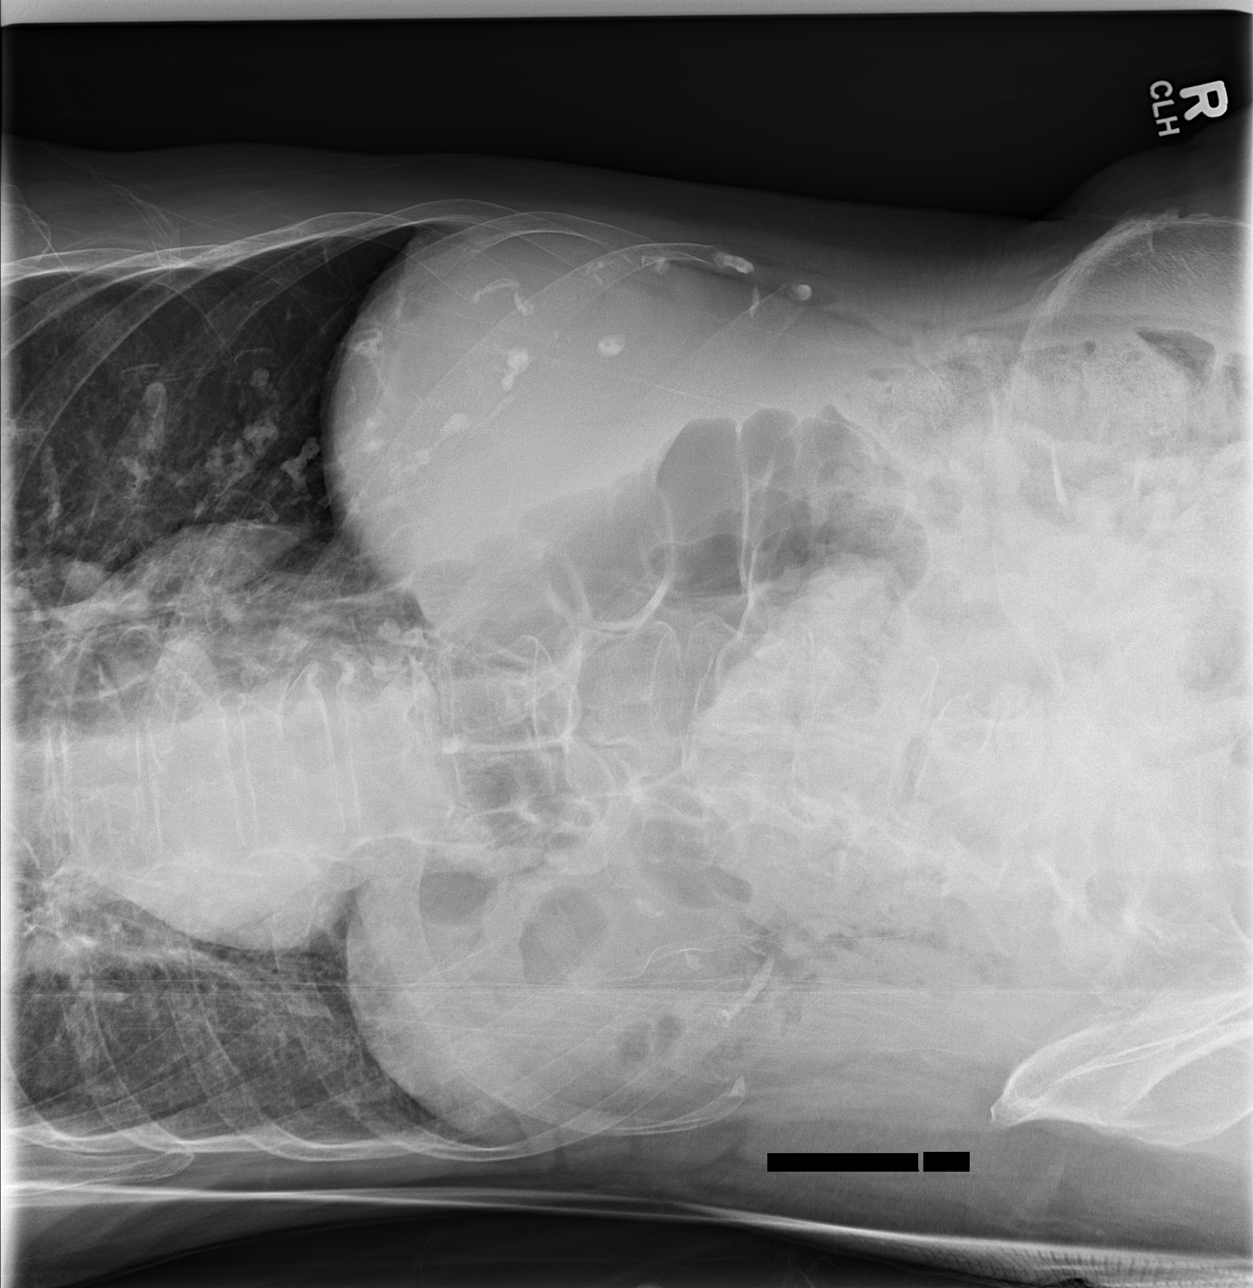

[x abdomen supine]
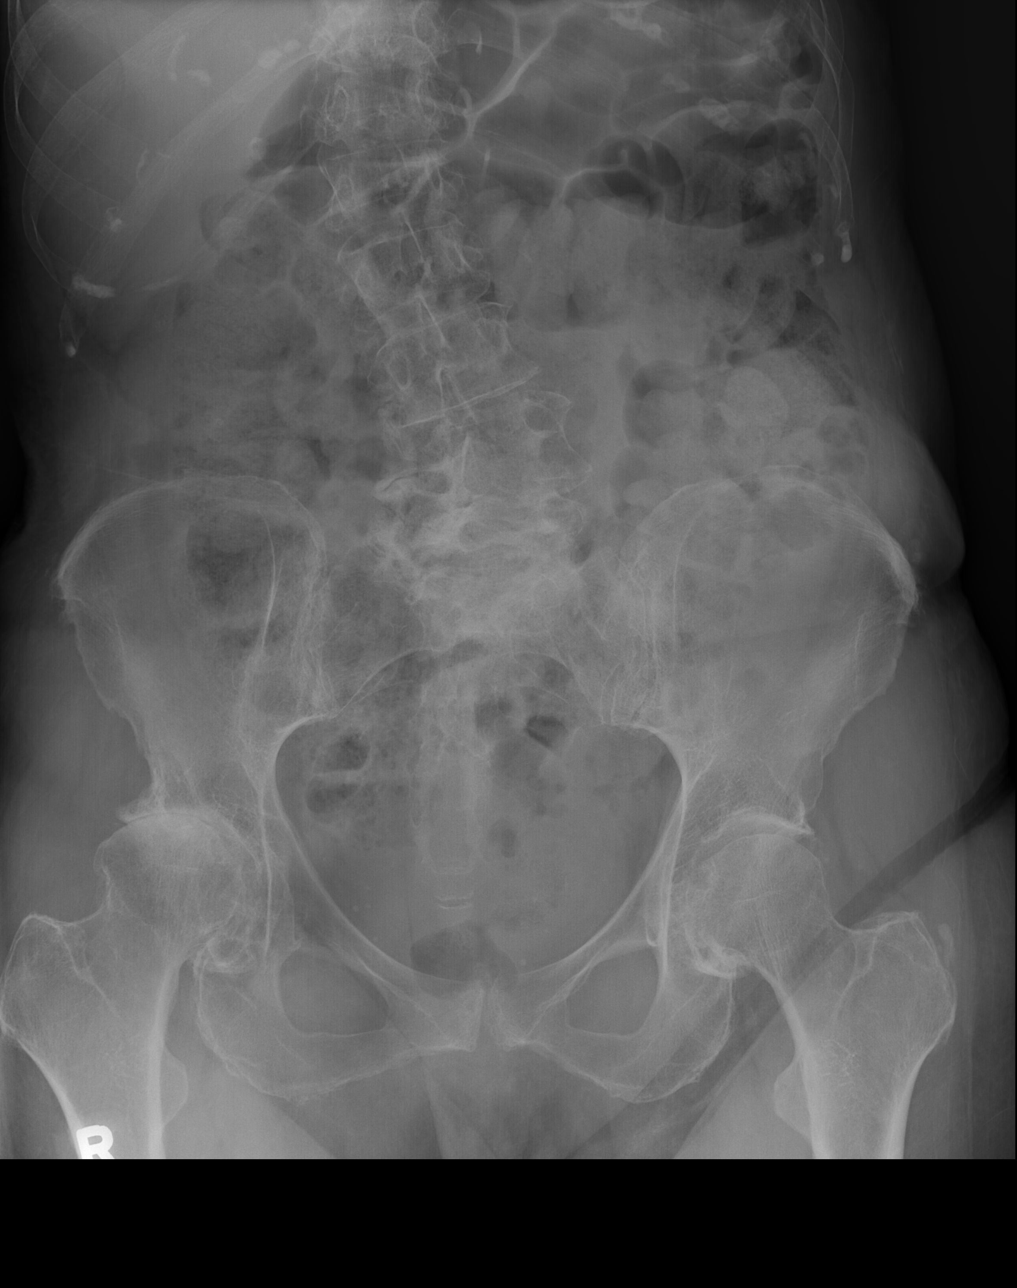

[x chest ap]
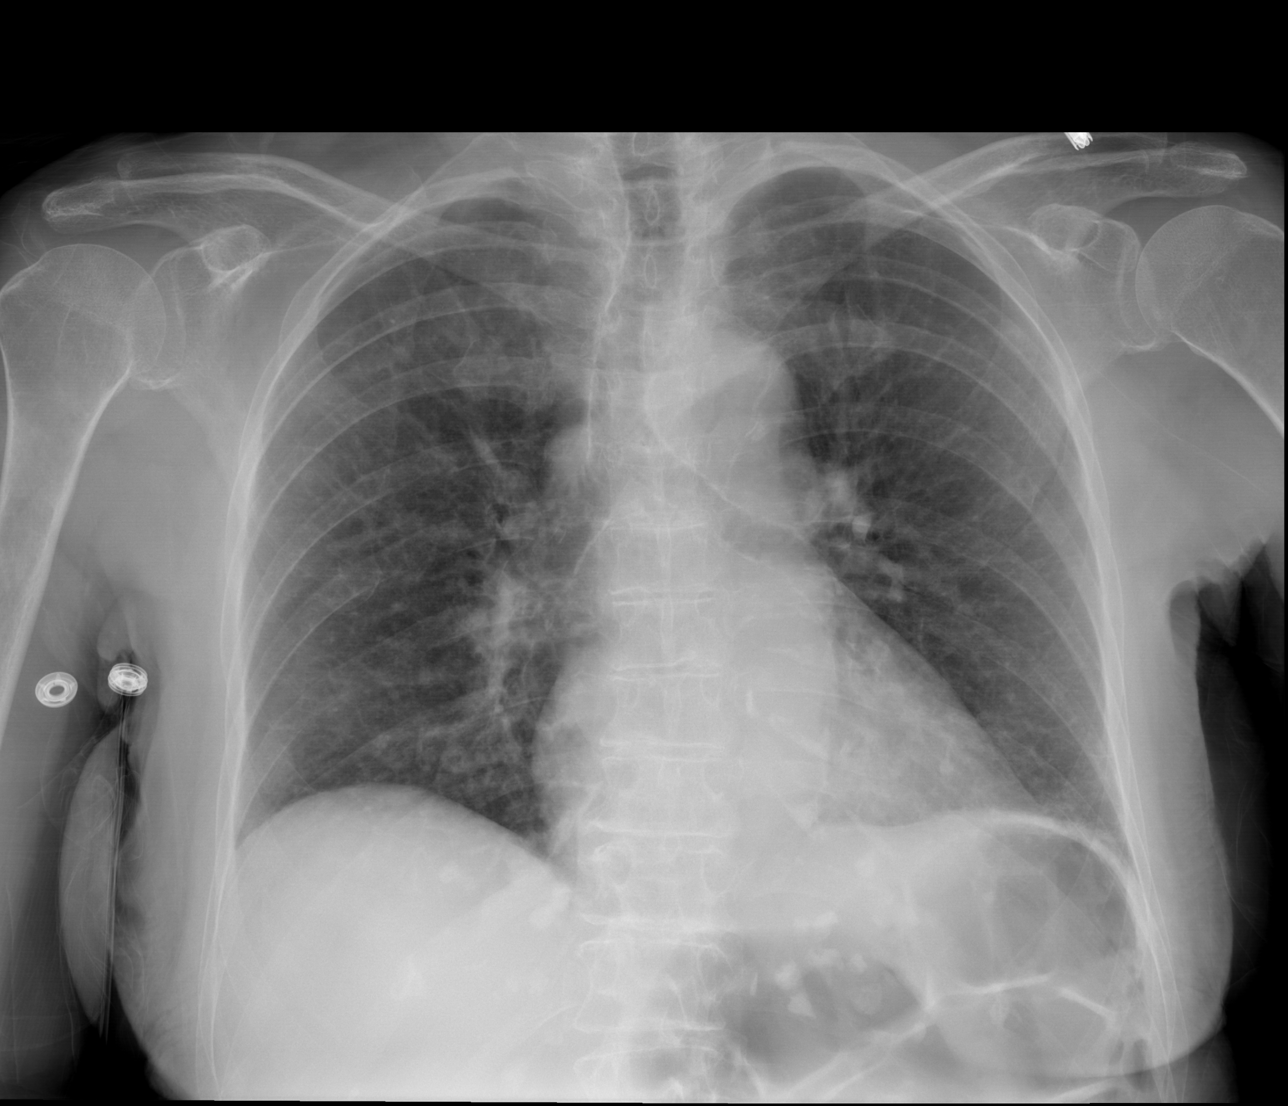

[3 of 3 positions shown; findings below may reference images not displayed]

FINDINGS: Portable AP semi upright view of the chest at 4403 hrs. Lung volumes
within normal limits. Normal cardiac size and mediastinal contours.
Visualized tracheal air column is within normal limits. No
pneumothorax or pneumoperitoneum. No confluent pulmonary opacity.

Supine and left-side-down lateral decubitus views of the abdomen. No
pneumoperitoneum. Non obstructed bowel gas pattern. Retained stool
in the colon. Osteopenia. Scoliosis and degenerative changes in the
lumbar spine. Advanced joint space loss and subchondral sclerosis at
the right hip. No acute osseous abnormality identified.
IMPRESSION: 1.  Non obstructed bowel gas pattern, no free air.
2.  No acute cardiopulmonary abnormality.
3. Degenerative osseous changes including severe right hip
osteoarthritis.

## 2016-02-10 IMAGING — CT CT ABD-PELV W/ CM
2 of 5 series · 17 of 46 positions shown, 19 images · IV contrast (Omni 300)
Comparison: None.

CLINICAL DATA: Abdominal pain.

EXAM:
CT ABDOMEN AND PELVIS WITH CONTRAST
TECHNIQUE: Multidetector CT imaging of the abdomen and pelvis was performed
using the standard protocol following bolus administration of
intravenous contrast.
CONTRAST:  100mL OMNIPAQUE IOHEXOL 300 MG/ML  SOLN

[Series 2: abd/ pelvis 5.0 i30f 1 · axial · 0.64mm/px · z∈[-406,-61]mm · 14 of 79 slices shown, 16 images]
[im 5/79  soft-tissue]
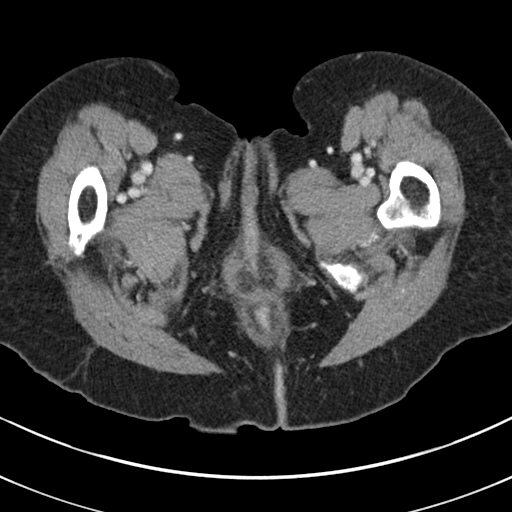
[im 5/79  bone]
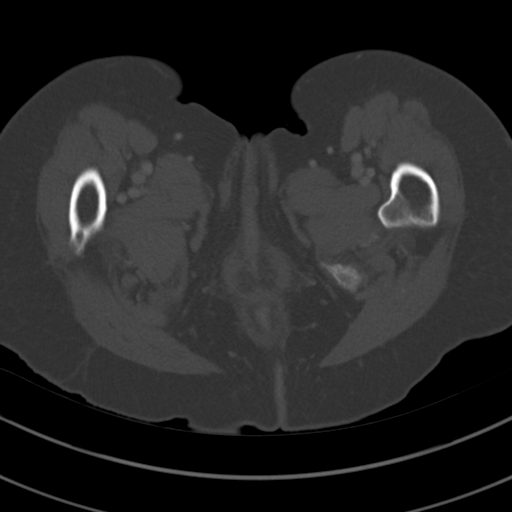
[im 9/79  soft-tissue]
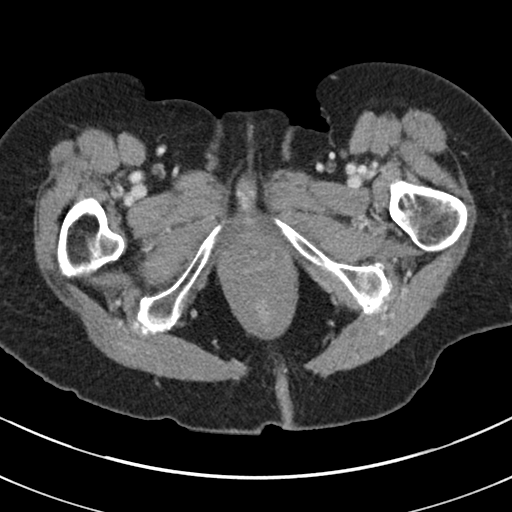
[im 17/79  soft-tissue]
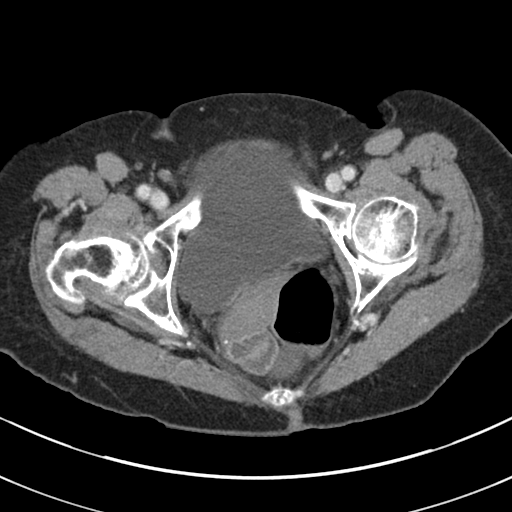
[im 21/79  soft-tissue]
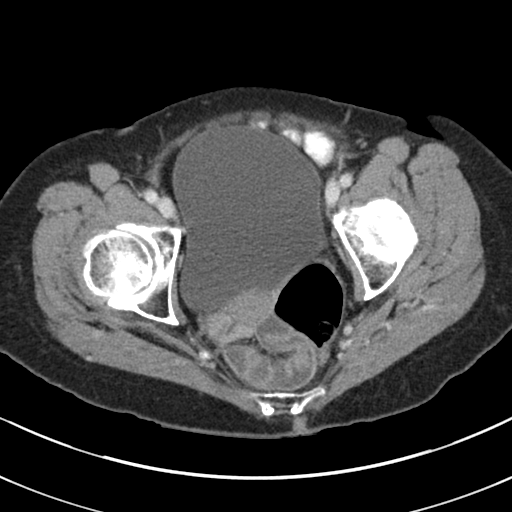
[im 25/79  soft-tissue]
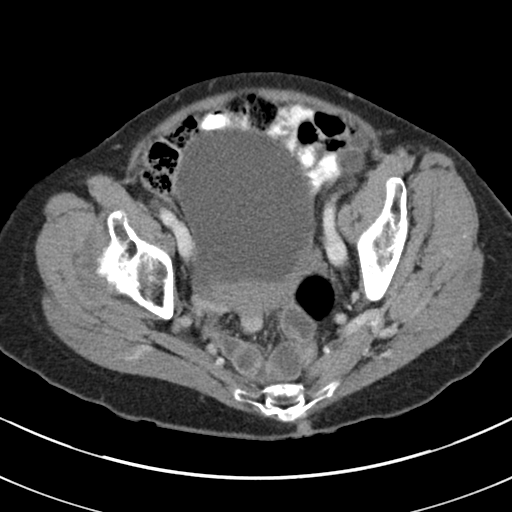
[im 33/79  soft-tissue]
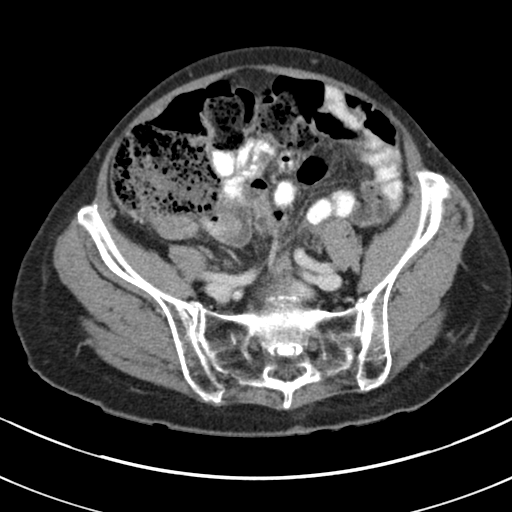
[im 37/79  soft-tissue]
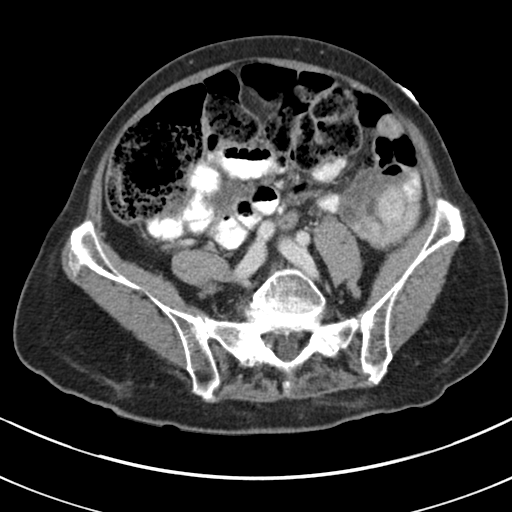
[im 42/79  soft-tissue]
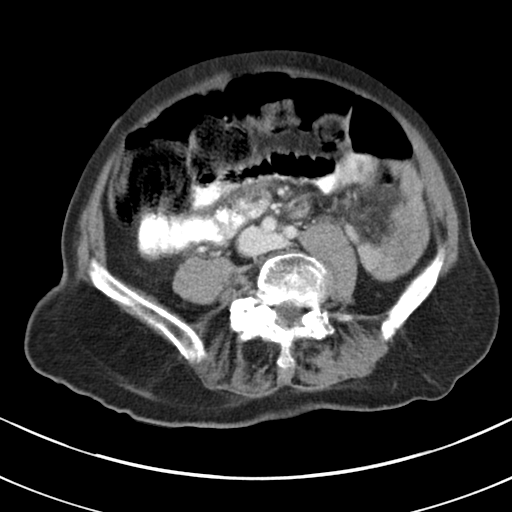
[im 46/79  soft-tissue]
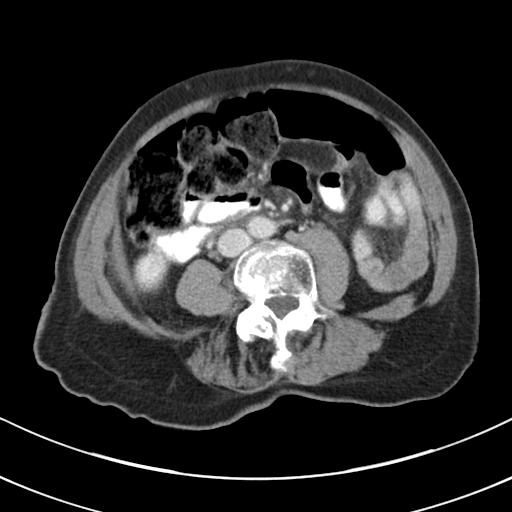
[im 46/79  bone]
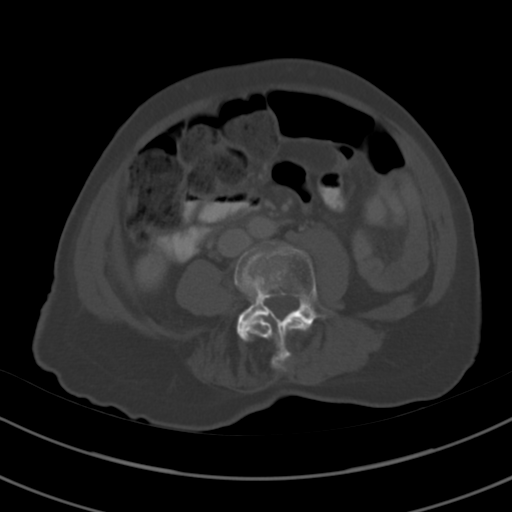
[im 54/79  soft-tissue]
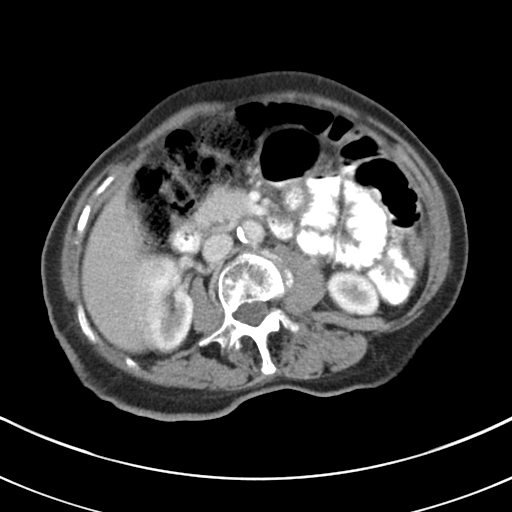
[im 58/79  soft-tissue]
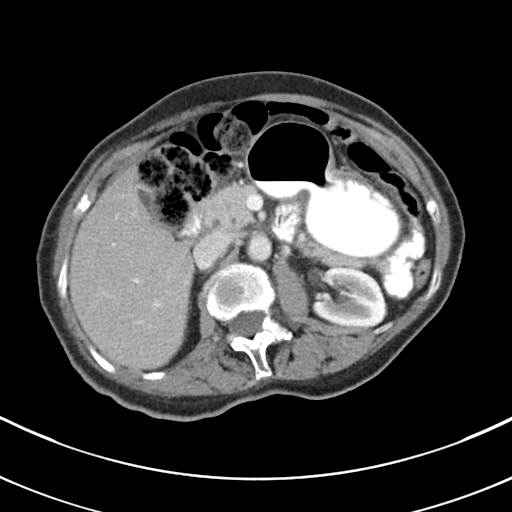
[im 62/79  soft-tissue]
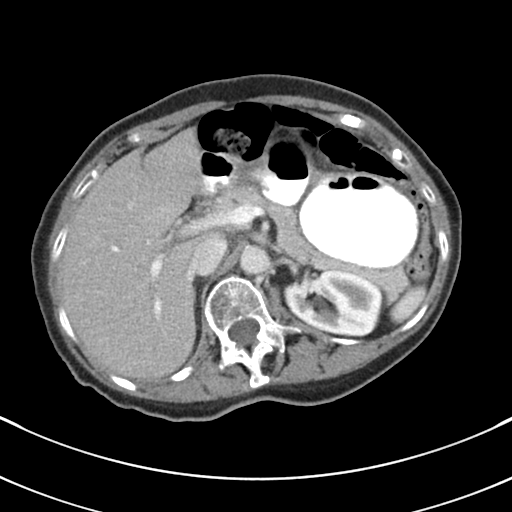
[im 70/79  soft-tissue]
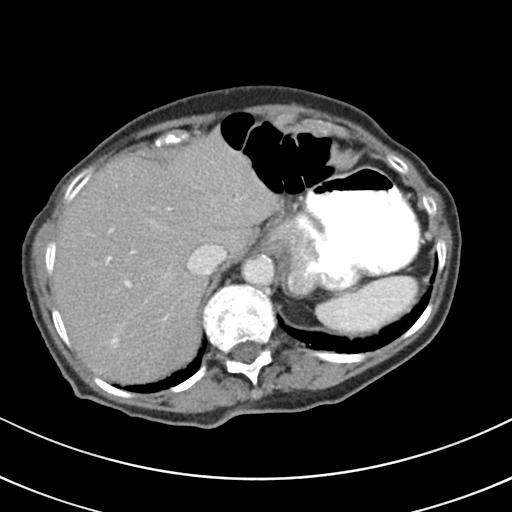
[im 74/79  soft-tissue]
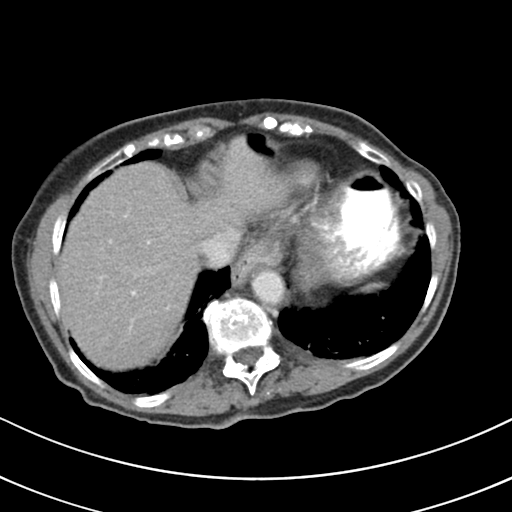

[Series 5: coronals · coronal · 0.68mm/px · 3 of 121 slices shown]
[im 41/121  soft-tissue]
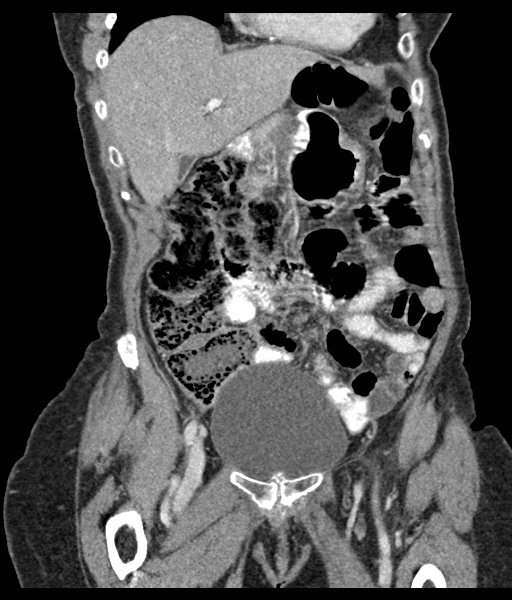
[im 54/121  soft-tissue]
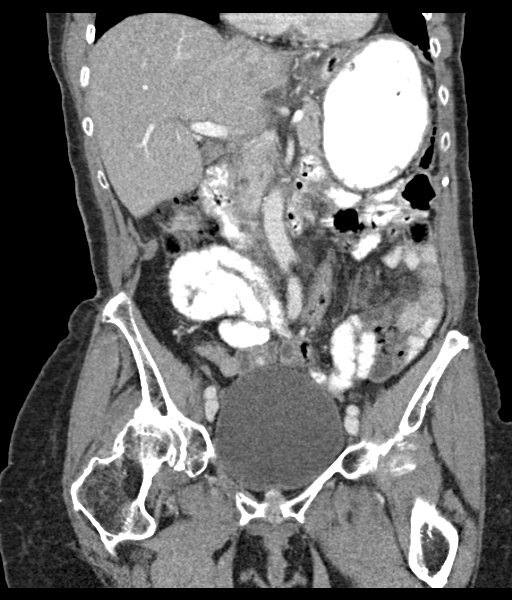
[im 67/121  soft-tissue]
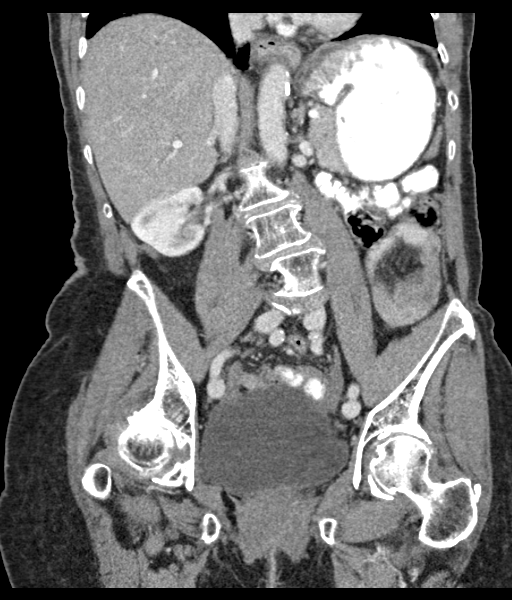

[17 of 46 positions shown; findings below may reference images not displayed]

FINDINGS: BODY WALL: Unremarkable.

LOWER CHEST: Mild atelectasis or scarring at the bases

ABDOMEN/PELVIS:

Liver: No focal abnormality.

Biliary: No evidence of biliary obstruction or stone.

Pancreas: Unremarkable.

Spleen: Unremarkable.

Adrenals: Unremarkable.

Kidneys and ureters: No hydronephrosis or stone.

Bladder: Moderate distention.

Reproductive: Unremarkable.

Bowel: Moderate volume of formed stool scattered throughout the
redundant colon. No bowel obstruction. No pericecal inflammation.

Retroperitoneum: No mass or adenopathy.

Peritoneum: No free fluid or gas.

Vascular: No acute abnormality. Aortic and branch vessel
atherosclerosis.

OSSEOUS: Hip osteoarthritis, right worse than left. Osteopenia.
Diffuse degenerative disc and facet disease, worst in the right
lower lumbar region along the concavity of a levo scoliotic
curvature.
IMPRESSION: No acute intra-abdominal abnormality.
